# Patient Record
Sex: Female | Born: 1955 | Race: White | Hispanic: No | Marital: Married | Smoking: Former smoker
Health system: Southern US, Community
[De-identification: ages and names within clinical notes are randomized; demographics above are authoritative.]

## PROBLEM LIST (undated history)

## (undated) DIAGNOSIS — I341 Nonrheumatic mitral (valve) prolapse: Secondary | ICD-10-CM

## (undated) DIAGNOSIS — E785 Hyperlipidemia, unspecified: Secondary | ICD-10-CM

## (undated) DIAGNOSIS — R001 Bradycardia, unspecified: Secondary | ICD-10-CM

## (undated) HISTORY — PX: TONSILLECTOMY: SUR1361

## (undated) HISTORY — DX: Bradycardia, unspecified: R00.1

## (undated) HISTORY — PX: MOHS SURGERY: SUR867

## (undated) HISTORY — DX: Hyperlipidemia, unspecified: E78.5

## (undated) HISTORY — DX: Nonrheumatic mitral (valve) prolapse: I34.1

---

## 1997-08-05 ENCOUNTER — Other Ambulatory Visit: Admission: RE | Admit: 1997-08-05 | Discharge: 1997-08-05 | Payer: Self-pay | Admitting: Obstetrics & Gynecology

## 1999-01-03 ENCOUNTER — Other Ambulatory Visit: Admission: RE | Admit: 1999-01-03 | Discharge: 1999-01-03 | Payer: Self-pay | Admitting: Obstetrics & Gynecology

## 1999-11-17 ENCOUNTER — Other Ambulatory Visit: Admission: RE | Admit: 1999-11-17 | Discharge: 1999-11-17 | Payer: Self-pay | Admitting: Obstetrics & Gynecology

## 2001-01-20 ENCOUNTER — Other Ambulatory Visit: Admission: RE | Admit: 2001-01-20 | Discharge: 2001-01-20 | Payer: Self-pay | Admitting: Obstetrics & Gynecology

## 2002-03-02 ENCOUNTER — Other Ambulatory Visit: Admission: RE | Admit: 2002-03-02 | Discharge: 2002-03-02 | Payer: Self-pay | Admitting: Obstetrics & Gynecology

## 2003-05-17 ENCOUNTER — Other Ambulatory Visit: Admission: RE | Admit: 2003-05-17 | Discharge: 2003-05-17 | Payer: Self-pay | Admitting: Obstetrics & Gynecology

## 2004-03-03 ENCOUNTER — Ambulatory Visit (HOSPITAL_COMMUNITY): Admission: RE | Admit: 2004-03-03 | Discharge: 2004-03-03 | Payer: Self-pay | Admitting: Neurosurgery

## 2004-06-20 ENCOUNTER — Other Ambulatory Visit: Admission: RE | Admit: 2004-06-20 | Discharge: 2004-06-20 | Payer: Self-pay | Admitting: Obstetrics & Gynecology

## 2005-07-12 ENCOUNTER — Other Ambulatory Visit: Admission: RE | Admit: 2005-07-12 | Discharge: 2005-07-12 | Payer: Self-pay | Admitting: Obstetrics & Gynecology

## 2006-05-28 ENCOUNTER — Encounter: Admission: RE | Admit: 2006-05-28 | Discharge: 2006-05-28 | Payer: Self-pay | Admitting: Obstetrics & Gynecology

## 2008-04-21 ENCOUNTER — Encounter: Admission: RE | Admit: 2008-04-21 | Discharge: 2008-04-21 | Payer: Self-pay | Admitting: Internal Medicine

## 2010-01-01 ENCOUNTER — Emergency Department (HOSPITAL_COMMUNITY): Admission: EM | Admit: 2010-01-01 | Discharge: 2010-01-02 | Payer: Self-pay | Admitting: Emergency Medicine

## 2010-04-23 ENCOUNTER — Encounter: Payer: Self-pay | Admitting: Neurosurgery

## 2013-12-30 ENCOUNTER — Other Ambulatory Visit: Payer: Self-pay | Admitting: Internal Medicine

## 2013-12-30 DIAGNOSIS — Z801 Family history of malignant neoplasm of trachea, bronchus and lung: Secondary | ICD-10-CM

## 2014-01-14 ENCOUNTER — Ambulatory Visit
Admission: RE | Admit: 2014-01-14 | Discharge: 2014-01-14 | Disposition: A | Discharge status: Home / Self Care | Payer: No Typology Code available for payment source | Source: Ambulatory Visit | Attending: Internal Medicine | Admitting: Internal Medicine

## 2014-01-14 DIAGNOSIS — Z801 Family history of malignant neoplasm of trachea, bronchus and lung: Secondary | ICD-10-CM

## 2016-01-31 DIAGNOSIS — E559 Vitamin D deficiency, unspecified: Secondary | ICD-10-CM | POA: Diagnosis not present

## 2016-01-31 DIAGNOSIS — Z0000 Encounter for general adult medical examination without abnormal findings: Secondary | ICD-10-CM | POA: Diagnosis not present

## 2016-02-02 DIAGNOSIS — Z0000 Encounter for general adult medical examination without abnormal findings: Secondary | ICD-10-CM | POA: Diagnosis not present

## 2016-02-03 ENCOUNTER — Telehealth: Payer: Self-pay | Admitting: Cardiology

## 2016-02-03 NOTE — Telephone Encounter (Signed)
Received records from [facility] for appointment on 02/16/16 with Dr Jens Somrenshaw.  Records [facility] to [facility] (medical records) for Dr Ludwig Clarksrenshaw's schedule on 02/16/16. lp

## 2016-02-10 ENCOUNTER — Telehealth: Payer: Self-pay | Admitting: Cardiology

## 2016-02-10 NOTE — Telephone Encounter (Signed)
[facility] on 02/20/16 with Dr Jens Somrenshaw.  [facility] ([facility]) for Dr Ludwig Clarksrenshaw's schedule on 02/20/16. lp

## 2016-02-13 NOTE — Progress Notes (Signed)
     HPI: 60 year old female for evaluation of bradycardia. Echocardiogram 2010 showed normal LV systolic function and trace mitral regurgitation. Treadmill was negative. Seen recently and referred for bradycardia. In reviewing electrocardiograms she has had ectopic bradycardia dating back for some years. Patient denies significant dyspnea on exertion, orthopnea, PND, pedal edema, chest pain, palpitations or syncope.  No current outpatient prescriptions on file.   No current facility-administered medications for this visit.     Not on File   Past Medical History:  Diagnosis Date  . Bradycardia   . MVP (mitral valve prolapse)     Past Surgical History:  Procedure Laterality Date  . TONSILLECTOMY      Social History   Social History  . Marital status: Married    Spouse name: N/A  . Number of children: 1  . Years of education: N/A   Occupational History  . Not on file.   Social History Main Topics  . Smoking status: Former Games developermoker  . Smokeless tobacco: Never Used  . Alcohol use Yes     Comment: 2-3 glasses wine  . Drug use: Unknown  . Sexual activity: Not on file   Other Topics Concern  . Not on file   Social History Narrative  . No narrative on file    Family History  Problem Relation Age of Onset  . Colon cancer Brother     ROS: no fevers or chills, productive cough, hemoptysis, dysphasia, odynophagia, melena, hematochezia, dysuria, hematuria, rash, seizure activity, orthopnea, PND, pedal edema, claudication. Remaining systems are negative.  Physical Exam:   Blood pressure 132/71, pulse (!) 53, height 5\' 7"  (1.702 m), weight 148 lb (67.1 kg).  General:  Well developed/well nourished in NAD Skin warm/dry Patient not depressed No peripheral clubbing Back-normal HEENT-normal/normal eyelids Neck supple/normal carotid upstroke bilaterally; no bruits; no JVD; no thyromegaly chest - CTA/ normal expansion CV - RRR/normal S1 and S2; no murmurs, rubs or  gallops;  PMI nondisplaced Abdomen -NT/ND, no HSM, no mass, + bowel sounds, no bruit 2+ femoral pulses, no bruits Ext-no edema, chords, 2+ DP Neuro-grossly nonfocal  ECG -Sinus bradycardia at a rate of 53. RV conduction delay. No ST changes.  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 Apparently she had one with primary care with a heart rate of 42. We will obtain that ECG for review. However she has no symptoms including no significant dyspnea, chest pain or history of syncope. She does not require further cardiac evaluation. Check TSH.   2 History of mitral valve prolapse-not evident on most recent echocardiogram.   Olga MillersBrian Lakiesha Ralphs, MD

## 2016-02-16 ENCOUNTER — Ambulatory Visit: Payer: No Typology Code available for payment source | Admitting: Cardiology

## 2016-02-20 ENCOUNTER — Encounter: Payer: Self-pay | Admitting: Cardiology

## 2016-02-20 ENCOUNTER — Ambulatory Visit (INDEPENDENT_AMBULATORY_CARE_PROVIDER_SITE_OTHER): Payer: BLUE CROSS/BLUE SHIELD | Admitting: Cardiology

## 2016-02-20 VITALS — BP 132/71 | HR 53 | Ht 67.0 in | Wt 148.0 lb

## 2016-02-20 DIAGNOSIS — R001 Bradycardia, unspecified: Secondary | ICD-10-CM

## 2016-02-20 NOTE — Patient Instructions (Signed)
Your physician recommends that you schedule a follow-up appointment in: AS NEEDED  

## 2016-05-04 ENCOUNTER — Ambulatory Visit (INDEPENDENT_AMBULATORY_CARE_PROVIDER_SITE_OTHER): Payer: BLUE CROSS/BLUE SHIELD | Admitting: Osteopathic Medicine

## 2016-05-04 VITALS — BP 104/66 | HR 60 | Temp 98.6°F | Resp 18 | Ht 67.0 in | Wt 149.0 lb

## 2016-05-04 DIAGNOSIS — R0981 Nasal congestion: Secondary | ICD-10-CM | POA: Diagnosis not present

## 2016-05-04 DIAGNOSIS — J029 Acute pharyngitis, unspecified: Secondary | ICD-10-CM

## 2016-05-04 LAB — POCT RAPID STREP A (OFFICE): Rapid Strep A Screen: NEGATIVE

## 2016-05-04 MED ORDER — IPRATROPIUM BROMIDE 0.03 % NA SOLN: 2.0000 | Freq: Three times a day (TID) | NASAL | 0 refills | Status: DC | PRN

## 2016-05-04 MED ORDER — PENICILLIN V POTASSIUM 500 MG PO TABS: 500.0000 mg | ORAL_TABLET | Freq: Three times a day (TID) | ORAL | 0 refills | Status: DC

## 2016-05-04 MED ORDER — LIDOCAINE VISCOUS HCL 2 % MT SOLN: 10.0000 mL | OROMUCOSAL | 0 refills | Status: DC | PRN

## 2016-05-04 NOTE — Progress Notes (Signed)
HPI: Danielle Ferrell is a 61 y.o. female who presents to [facility] today, 05/04/16 for chief complaint of:  Chief Complaint  Patient presents with  . Sore Throat    Acute Illness: . Location: throat, some sinus congestion . Quality: started w/ sore throat and coughing . Assoc signs/symptoms: see ROS - fatigue, sleeping more, feels like teeth hurt a bit, eyes look small  . Duration: 4-5 days . Modifying factors: has tried the following OTC/Rx medications: Cold-Eeze at first, not getting any better, resting    Past medical, social and family history reviewed.   Immune compromising conditions or other risk factors: none  Current medications and allergies reviewed.     Review of Systems:  Constitutional: 100 degree temperature, up and down, no chills  HEENT: some headache, Yes  sore throat, No  swollen glands  Cardiovascular: No chest pain  Respiratory:No  cough, No  shortness of breath  Gastrointestinal: No  nausea, No  vomiting,  No  diarrhea  Musculoskeletal:   Yes  myalgia/arthralgia  Skin/Integument:  No  rash   Detailed Exam:  BP 104/66 (BP Location: Right Arm, Patient Position: Sitting, Cuff Size: Small)   Pulse 60   Temp 98.6 F (37 C) (Oral)   Resp 18   Ht 5\' 7"  (1.702 m)   Wt 149 lb (67.6 kg)   SpO2 99%   BMI 23.34 kg/m   Constitutional:   VSS, see above.   General Appearance: alert, well-developed, well-nourished, NAD  Eyes:   Normal lids and conjunctive, non-icteric sclera  Ears, Nose, Mouth, Throat:   Normal external inspection ears/nares  Normal mouth/lips/gums, MMM  normal TM  posterior pharynx with erythema, without exudate  nasal mucosa normal  Skin:  Normal inspection, no rash or concerning lesions noted on limited exam  Neck:   No masses, trachea midline. no elarged or tender lymph nodes  Respiratory:   Normal respiratory effort.   No  wheeze/rhonchi/rales  Cardiovascular:   S1/S2 normal, no  murmur/rub/gallop auscultated. RRR.   Results for orders placed or performed in visit on 05/04/16 (from the past 72 hour(s))  POCT rapid strep A     Status: None   Collection Time: 05/04/16  6:06 PM  Result Value Ref Range   Rapid Strep A Screen Negative Negative   No results found.  MODIFIED CENTOR CRITERIA (ponts if "yes"): Tonsillar exudate (1): no Tender Ant Cervical LN (1): mild tender but not enlarged Absence of cough (1): yes Fever (1): yes Age  17-14 (1): no 15-45 (0): no >/= 45 (-1): yes SCORE: 1-2    ASSESSMENT/PLAN:  Sore throat - Plan: POCT rapid strep A, Lidocaine HCl 2 % SOLN, Culture, Group A Strep, penicillin v potassium (VEETID) 500 MG tablet, ipratropium (ATROVENT) 0.03 % nasal spray  Nasal congestion - Plan: ipratropium (ATROVENT) 0.03 % nasal spray  OTC medications list printed - highlighted portion of sore throat treatment OTC to try  Abx to fill if fever, worsening, or if called about (+) strep culture. See printed Rx instructions.    Patient Instructions   Pharyngitis Pharyngitis is redness, pain, and swelling (inflammation) of your pharynx. What are the causes? Pharyngitis is usually caused by infection. Most of the time, these infections are from viruses (viral) and are part of a cold. However, sometimes pharyngitis is caused by bacteria (bacterial). Pharyngitis can also be caused by allergies. Viral pharyngitis may be spread from person to person by coughing, sneezing, and personal items  or utensils (cups, forks, spoons, toothbrushes). Bacterial pharyngitis may be spread from person to person by more intimate contact, such as kissing. What are the signs or symptoms? Symptoms of pharyngitis include:  Sore throat.  Tiredness (fatigue).  Low-grade fever.  Headache.  Joint pain and muscle aches.  Skin rashes.  Swollen lymph nodes.  Plaque-like film on throat or tonsils (often seen with bacterial pharyngitis). How is this diagnosed? Your  health care provider will ask you questions about your illness and your symptoms. Your medical history, along with a physical exam, is often all that is needed to diagnose pharyngitis. Sometimes, a rapid strep test is done. Other lab tests may also be done, depending on the suspected cause. How is this treated? Viral pharyngitis will usually get better in 3-4 days without the use of medicine. Bacterial pharyngitis is treated with medicines that kill germs (antibiotics). Follow these instructions at home:  Drink enough water and fluids to keep your urine clear or pale yellow.  Only take over-the-counter or prescription medicines as directed by your health care provider:  If you are prescribed antibiotics, make sure you finish them even if you start to feel better.  Do not take aspirin.  Get lots of rest.  Gargle with 8 oz of salt water ( tsp of salt per 1 qt of water) as often as every 1-2 hours to soothe your throat.  Throat lozenges (if you are not at risk for choking) or sprays may be used to soothe your throat. Contact a health care provider if:  You have large, tender lumps in your neck.  You have a rash.  You cough up green, yellow-brown, or bloody spit. Get help right away if:  Your neck becomes stiff.  You drool or are unable to swallow liquids.  You vomit or are unable to keep medicines or liquids down.  You have severe pain that does not go away with the use of recommended medicines.  You have trouble breathing (not caused by a stuffy nose). This information is not intended to replace advice given to you by your health care provider. Make sure you discuss any questions you have with your health care provider. Document Released: 03/19/2005 Document Revised: 08/25/2015 Document Reviewed: 11/24/2012 Elsevier Interactive Patient Education  2017 [employer].   OTC medications for viral illness Note: the following list assumes no pregnancy, normal liver & kidney  function and no other drug interactions. Dr. Lyn Hollingshead has highlighted medications which are safe for you to use, but these may not be appropriate for everyone. Always ask a pharmacist or qualified medical provider if there are any questions!    Aches/Pains, Fever Acetaminophen (Tylenol) 500 mg tablets - take max 2 tablets (1000 mg) every 6 hours (4 times per day)  Ibuprofen (Motrin) 200 mg tablets - take max 4 tablets (800 mg) every 6 hours  Sinus Congestion Prescription Atrovent Cromolyn Nasal Spray (NasalCrom) 1 spray each nostril 3-4 times per day, max 6 imes per day Nasal Saline if desired Oxymetolazone (Afrin, others) sparing use due to rebound congestion Phenylephrine (Sudafed) 10 mg tablets every 4 hours (or the 12-hour formulation) Diphenhydramine (Benadryl) 25 mg tablets - take max 2 tablets every 4 hours  Cough & Sore Throat Prescription cough pills or syrups Dextromethorphan (Robitussin, others) - cough suppressant Guaifenesin (Robitussin, Mucinex, others) - expectorant (helps cough up mucus) (Dextromethorphan and Guaifenesin also come in a combination tablet) Lozenges w/ Benzocaine + Menthol (Cepacol) Honey - as much as you want! Teas which "  coat the throat" - look for ingredients Elm Bark, Licorice Root, Marshmallow Root Humidifier by the bed  Other Zinc Lozenges within 24 hours of symptoms onset - mixed evidence this shortens the duration of the common cold Don't waste your money on Vitamin C or Echinacea       IF you received an x-ray today, you will receive an invoice from [facility]. Please contact [facility] at [phone] with questions or concerns regarding your invoice.   IF you received labwork today, you will receive an invoice from [facility]. Please contact [facility] at [phone] with questions or concerns regarding your invoice.   Our billing staff will not be able to assist you with questions regarding bills from these  companies.  You will be contacted with the lab results as soon as they are available. The fastest way to get your results is to activate your My Chart account. Instructions are located on the last page of this paperwork. If you have not heard from [place] regarding the results in 2 weeks, please contact this office.        Visit summary was printed for the patient with medications and pertinent instructions for patient to review. ER/RTC precautions reviewed. All questions answered. No Follow-up on file.

## 2016-05-04 NOTE — Patient Instructions (Addendum)
Pharyngitis Pharyngitis is redness, pain, and swelling (inflammation) of your pharynx. What are the causes? Pharyngitis is usually caused by infection. Most of the time, these infections are from viruses (viral) and are part of a cold. However, sometimes pharyngitis is caused by bacteria (bacterial). Pharyngitis can also be caused by allergies. Viral pharyngitis may be spread from person to person by coughing, sneezing, and personal items or utensils (cups, forks, spoons, toothbrushes). Bacterial pharyngitis may be spread from person to person by more intimate contact, such as kissing. What are the signs or symptoms? Symptoms of pharyngitis include:  Sore throat.  Tiredness (fatigue).  Low-grade fever.  Headache.  Joint pain and muscle aches.  Skin rashes.  Swollen lymph nodes.  Plaque-like film on throat or tonsils (often seen with bacterial pharyngitis). How is this diagnosed? Your health care provider will ask you questions about your illness and your symptoms. Your medical history, along with a physical exam, is often all that is needed to diagnose pharyngitis. Sometimes, a rapid strep test is done. Other lab tests may also be done, depending on the suspected cause. How is this treated? Viral pharyngitis will usually get better in 3-4 days without the use of medicine. Bacterial pharyngitis is treated with medicines that kill germs (antibiotics). Follow these instructions at home:  Drink enough water and fluids to keep your urine clear or pale yellow.  Only take over-the-counter or prescription medicines as directed by your health care provider:  If you are prescribed antibiotics, make sure you finish them even if you start to feel better.  Do not take aspirin.  Get lots of rest.  Gargle with 8 oz of salt water ( tsp of salt per 1 qt of water) as often as every 1-2 hours to soothe your throat.  Throat lozenges (if you are not at risk for choking) or sprays may be used to  soothe your throat. Contact a health care provider if:  You have large, tender lumps in your neck.  You have a rash.  You cough up green, yellow-brown, or bloody spit. Get help right away if:  Your neck becomes stiff.  You drool or are unable to swallow liquids.  You vomit or are unable to keep medicines or liquids down.  You have severe pain that does not go away with the use of recommended medicines.  You have trouble breathing (not caused by a stuffy nose). This information is not intended to replace advice given to you by your health care provider. Make sure you discuss any questions you have with your health care provider. Document Released: 03/19/2005 Document Revised: 08/25/2015 Document Reviewed: 11/24/2012 Elsevier Interactive Patient Education  2017 [employer].   OTC medications for viral illness Note: the following list assumes no pregnancy, normal liver & kidney function and no other drug interactions. Dr. Lyn Hollingshead has highlighted medications which are safe for you to use, but these may not be appropriate for everyone. Always ask a pharmacist or qualified medical provider if there are any questions!    Aches/Pains, Fever Acetaminophen (Tylenol) 500 mg tablets - take max 2 tablets (1000 mg) every 6 hours (4 times per day)  Ibuprofen (Motrin) 200 mg tablets - take max 4 tablets (800 mg) every 6 hours  Sinus Congestion Prescription Atrovent Cromolyn Nasal Spray (NasalCrom) 1 spray each nostril 3-4 times per day, max 6 imes per day Nasal Saline if desired Oxymetolazone (Afrin, others) sparing use due to rebound congestion Phenylephrine (Sudafed) 10 mg tablets every 4 hours (or  the 12-hour formulation) Diphenhydramine (Benadryl) 25 mg tablets - take max 2 tablets every 4 hours  Cough & Sore Throat Prescription cough pills or syrups Dextromethorphan (Robitussin, others) - cough suppressant Guaifenesin (Robitussin, Mucinex, others) - expectorant (helps cough up  mucus) (Dextromethorphan and Guaifenesin also come in a combination tablet) Lozenges w/ Benzocaine + Menthol (Cepacol) Honey - as much as you want! Teas which "coat the throat" - look for ingredients Elm Bark, Licorice Root, Marshmallow Root Humidifier by the bed  Other Zinc Lozenges within 24 hours of symptoms onset - mixed evidence this shortens the duration of the common cold Don't waste your money on Vitamin C or Echinacea       IF you received an x-ray today, you will receive an invoice from [facility]. Please contact [facility] at [phone] with questions or concerns regarding your invoice.   IF you received labwork today, you will receive an invoice from [facility]. Please contact [facility] at [phone] with questions or concerns regarding your invoice.   Our billing staff will not be able to assist you with questions regarding bills from these companies.  You will be contacted with the lab results as soon as they are available. The fastest way to get your results is to activate your My Chart account. Instructions are located on the last page of this paperwork. If you have not heard from us regarding the results in 2 weeks, please contact this office.

## 2016-05-07 LAB — CULTURE, GROUP A STREP: STREP A CULTURE: NEGATIVE

## 2016-05-15 ENCOUNTER — Encounter: Payer: Self-pay | Admitting: Obstetrics & Gynecology

## 2016-05-15 DIAGNOSIS — Z1151 Encounter for screening for human papillomavirus (HPV): Secondary | ICD-10-CM | POA: Diagnosis not present

## 2016-05-15 DIAGNOSIS — R875 Abnormal microbiological findings in specimens from female genital organs: Secondary | ICD-10-CM | POA: Diagnosis not present

## 2016-05-15 DIAGNOSIS — Z6822 Body mass index (BMI) 22.0-22.9, adult: Secondary | ICD-10-CM | POA: Diagnosis not present

## 2016-05-15 DIAGNOSIS — Z1231 Encounter for screening mammogram for malignant neoplasm of breast: Secondary | ICD-10-CM | POA: Diagnosis not present

## 2016-05-15 DIAGNOSIS — Z01419 Encounter for gynecological examination (general) (routine) without abnormal findings: Secondary | ICD-10-CM | POA: Diagnosis not present

## 2016-05-16 DIAGNOSIS — D225 Melanocytic nevi of trunk: Secondary | ICD-10-CM | POA: Diagnosis not present

## 2016-05-16 DIAGNOSIS — D2272 Melanocytic nevi of left lower limb, including hip: Secondary | ICD-10-CM | POA: Diagnosis not present

## 2016-05-16 DIAGNOSIS — L570 Actinic keratosis: Secondary | ICD-10-CM | POA: Diagnosis not present

## 2016-05-16 DIAGNOSIS — D2221 Melanocytic nevi of right ear and external auricular canal: Secondary | ICD-10-CM | POA: Diagnosis not present

## 2016-05-18 DIAGNOSIS — R87619 Unspecified abnormal cytological findings in specimens from cervix uteri: Secondary | ICD-10-CM | POA: Diagnosis not present

## 2016-07-31 DIAGNOSIS — M25562 Pain in left knee: Secondary | ICD-10-CM | POA: Diagnosis not present

## 2016-08-22 DIAGNOSIS — D485 Neoplasm of uncertain behavior of skin: Secondary | ICD-10-CM | POA: Diagnosis not present

## 2016-08-22 DIAGNOSIS — C44712 Basal cell carcinoma of skin of right lower limb, including hip: Secondary | ICD-10-CM | POA: Diagnosis not present

## 2016-11-06 DIAGNOSIS — C44712 Basal cell carcinoma of skin of right lower limb, including hip: Secondary | ICD-10-CM | POA: Diagnosis not present

## 2017-02-06 DIAGNOSIS — E559 Vitamin D deficiency, unspecified: Secondary | ICD-10-CM | POA: Diagnosis not present

## 2017-02-06 DIAGNOSIS — Z0000 Encounter for general adult medical examination without abnormal findings: Secondary | ICD-10-CM | POA: Diagnosis not present

## 2017-02-12 DIAGNOSIS — E875 Hyperkalemia: Secondary | ICD-10-CM | POA: Diagnosis not present

## 2017-03-21 DIAGNOSIS — M4722 Other spondylosis with radiculopathy, cervical region: Secondary | ICD-10-CM | POA: Diagnosis not present

## 2017-03-21 DIAGNOSIS — M542 Cervicalgia: Secondary | ICD-10-CM | POA: Diagnosis not present

## 2017-04-08 DIAGNOSIS — M7912 Myalgia of auxiliary muscles, head and neck: Secondary | ICD-10-CM | POA: Diagnosis not present

## 2017-04-08 DIAGNOSIS — M9901 Segmental and somatic dysfunction of cervical region: Secondary | ICD-10-CM | POA: Diagnosis not present

## 2017-04-08 DIAGNOSIS — M9902 Segmental and somatic dysfunction of thoracic region: Secondary | ICD-10-CM | POA: Diagnosis not present

## 2017-04-08 DIAGNOSIS — M5412 Radiculopathy, cervical region: Secondary | ICD-10-CM | POA: Diagnosis not present

## 2017-04-10 DIAGNOSIS — M9902 Segmental and somatic dysfunction of thoracic region: Secondary | ICD-10-CM | POA: Diagnosis not present

## 2017-04-10 DIAGNOSIS — M5412 Radiculopathy, cervical region: Secondary | ICD-10-CM | POA: Diagnosis not present

## 2017-04-10 DIAGNOSIS — M7912 Myalgia of auxiliary muscles, head and neck: Secondary | ICD-10-CM | POA: Diagnosis not present

## 2017-04-10 DIAGNOSIS — M9901 Segmental and somatic dysfunction of cervical region: Secondary | ICD-10-CM | POA: Diagnosis not present

## 2017-04-12 DIAGNOSIS — M5412 Radiculopathy, cervical region: Secondary | ICD-10-CM | POA: Diagnosis not present

## 2017-04-12 DIAGNOSIS — M7912 Myalgia of auxiliary muscles, head and neck: Secondary | ICD-10-CM | POA: Diagnosis not present

## 2017-04-12 DIAGNOSIS — M9902 Segmental and somatic dysfunction of thoracic region: Secondary | ICD-10-CM | POA: Diagnosis not present

## 2017-04-12 DIAGNOSIS — M9901 Segmental and somatic dysfunction of cervical region: Secondary | ICD-10-CM | POA: Diagnosis not present

## 2017-05-21 DIAGNOSIS — H52203 Unspecified astigmatism, bilateral: Secondary | ICD-10-CM | POA: Diagnosis not present

## 2017-05-21 DIAGNOSIS — Z9889 Other specified postprocedural states: Secondary | ICD-10-CM | POA: Diagnosis not present

## 2017-05-21 DIAGNOSIS — H524 Presbyopia: Secondary | ICD-10-CM | POA: Diagnosis not present

## 2017-05-21 DIAGNOSIS — H5203 Hypermetropia, bilateral: Secondary | ICD-10-CM | POA: Diagnosis not present

## 2018-01-17 ENCOUNTER — Ambulatory Visit (INDEPENDENT_AMBULATORY_CARE_PROVIDER_SITE_OTHER): Payer: BLUE CROSS/BLUE SHIELD | Admitting: Podiatry

## 2018-01-17 ENCOUNTER — Ambulatory Visit (INDEPENDENT_AMBULATORY_CARE_PROVIDER_SITE_OTHER): Payer: BLUE CROSS/BLUE SHIELD

## 2018-01-17 ENCOUNTER — Other Ambulatory Visit: Payer: Self-pay | Admitting: Podiatry

## 2018-01-17 DIAGNOSIS — S99922A Unspecified injury of left foot, initial encounter: Secondary | ICD-10-CM

## 2018-01-17 DIAGNOSIS — S92422A Displaced fracture of distal phalanx of left great toe, initial encounter for closed fracture: Secondary | ICD-10-CM | POA: Diagnosis not present

## 2018-01-17 DIAGNOSIS — M79675 Pain in left toe(s): Secondary | ICD-10-CM | POA: Diagnosis not present

## 2018-01-17 NOTE — Patient Instructions (Signed)

## 2018-01-24 ENCOUNTER — Telehealth: Payer: Self-pay | Admitting: Podiatry

## 2018-01-24 NOTE — Telephone Encounter (Signed)
I called pt, informed pt she needed to keep her activities to ADL in the stiff soled surgery shoe, for 4-6 weeks. Pt asked if she could exercise. I told pt she could do upper body, but nothing that pushed with the feet. Pt asked if she could do the bike and I told her yes, but with no tension. Pt states understanding.

## 2018-01-24 NOTE — Telephone Encounter (Signed)
I was seen a week ago for a fractured toe. I would like to know what I can and cannot do. I was given instructions for soaking. I look forward to your kind reply and my number is [phone].

## 2018-02-07 ENCOUNTER — Ambulatory Visit (INDEPENDENT_AMBULATORY_CARE_PROVIDER_SITE_OTHER): Payer: BLUE CROSS/BLUE SHIELD

## 2018-02-07 ENCOUNTER — Ambulatory Visit (INDEPENDENT_AMBULATORY_CARE_PROVIDER_SITE_OTHER): Payer: BLUE CROSS/BLUE SHIELD | Admitting: Podiatry

## 2018-02-07 DIAGNOSIS — S99922D Unspecified injury of left foot, subsequent encounter: Secondary | ICD-10-CM

## 2018-02-07 DIAGNOSIS — L608 Other nail disorders: Secondary | ICD-10-CM

## 2018-02-07 DIAGNOSIS — S92422D Displaced fracture of distal phalanx of left great toe, subsequent encounter for fracture with routine healing: Secondary | ICD-10-CM | POA: Diagnosis not present

## 2018-02-13 DIAGNOSIS — E875 Hyperkalemia: Secondary | ICD-10-CM | POA: Diagnosis not present

## 2018-02-13 DIAGNOSIS — Z0000 Encounter for general adult medical examination without abnormal findings: Secondary | ICD-10-CM | POA: Diagnosis not present

## 2018-02-13 DIAGNOSIS — E001 Congenital iodine-deficiency syndrome, myxedematous type: Secondary | ICD-10-CM | POA: Diagnosis not present

## 2018-02-17 DIAGNOSIS — Z0000 Encounter for general adult medical examination without abnormal findings: Secondary | ICD-10-CM | POA: Diagnosis not present

## 2018-02-17 DIAGNOSIS — R001 Bradycardia, unspecified: Secondary | ICD-10-CM | POA: Diagnosis not present

## 2018-02-20 ENCOUNTER — Ambulatory Visit (INDEPENDENT_AMBULATORY_CARE_PROVIDER_SITE_OTHER): Payer: BLUE CROSS/BLUE SHIELD

## 2018-02-20 ENCOUNTER — Ambulatory Visit (INDEPENDENT_AMBULATORY_CARE_PROVIDER_SITE_OTHER): Payer: BLUE CROSS/BLUE SHIELD | Admitting: Podiatry

## 2018-02-20 DIAGNOSIS — S92422D Displaced fracture of distal phalanx of left great toe, subsequent encounter for fracture with routine healing: Secondary | ICD-10-CM | POA: Diagnosis not present

## 2018-02-20 NOTE — Progress Notes (Signed)
  Subjective:  Patient ID: Danielle Ferrell, female    DOB: 1955-07-06,  MRN: [id number]  Chief Complaint  Patient presents with  . Wound Check    Left 1st toe wound check. Pt states no new complaints.   62 y.o. female presents with the above complaint. States the toenail is looking fine and not hurting and wants to go back to activity. Soaked as directed for a bit.  Review of Systems: Negative except as noted in the HPI. Denies N/V/F/Ch.  Past Medical History:  Diagnosis Date  . Bradycardia   . MVP (mitral valve prolapse)     Current Outpatient Medications:  .  ipratropium (ATROVENT) 0.03 % nasal spray, Place 2 sprays into both nostrils 3 (three) times daily as needed for rhinitis. (Patient not taking: Reported on 01/17/2018), Disp: 30 mL, Rfl: 0 .  Lidocaine HCl 2 % SOLN, Use as directed 10-15 mLs in the mouth or throat every 3 (three) hours as needed (mouth/throat pain). (Patient not taking: Reported on 01/17/2018), Disp: 100 mL, Rfl: 0 .  penicillin v potassium (VEETID) 500 MG tablet, Take 1 tablet (500 mg total) by mouth 3 (three) times daily. Fill Rx if worsening sore throat, fever >100.1, or if you receive a phone call that culture was positive for strep (Patient not taking: Reported on 01/17/2018), Disp: 30 tablet, Rfl: 0  Social History   Tobacco Use  Smoking Status Former Smoker  Smokeless Tobacco Never Used    No Known Allergies Objective:  There were no vitals filed for this visit. There is no height or weight on file to calculate BMI. Constitutional Well developed. Well nourished.  Vascular Dorsalis pedis pulses palpable bilaterally. Posterior tibial pulses palpable bilaterally. Capillary refill normal to all digits.  No cyanosis or clubbing noted. Pedal hair growth normal.  Neurologic Normal speech. Oriented to person, place, and time. Epicritic sensation to light touch grossly present bilaterally.  Dermatologic Nail avulsion site healing well with overlying  soft crust.  Orthopedic: Normal joint ROM without pain or crepitus bilaterally. No visible deformities. POP R distal phalanx hallux   Radiographs: Taken and reviewed slight interval healing noted. Assessment:   1. Closed displaced fracture of distal phalanx of left great toe with routine healing, subsequent encounter   2. Onychomadesis of toenail   3. Injury of toenail of left foot, subsequent encounter    Plan:  Patient was evaluated and treated and all questions answered.  L Distal Phalanx Fx -XR taken with slight interval healing noted.  S/p Nail Avulsion -Healing well  Return in about 2 weeks (around 02/21/2018) for Fracture f/u.

## 2018-02-23 NOTE — Progress Notes (Signed)
  Subjective:  Patient ID: Danielle SagoSuzanne B Carnero, female    DOB: 1955-12-28,  MRN: [id number]  Chief Complaint  Patient presents with  . Fracture    left great toe - 3 week follow up    62 y.o. female presents with the above complaint.  Not really having any pain.  Wishes to get out of her surgical shoe.    Review of Systems: Negative except as noted in the HPI. Denies N/V/F/Ch.  Past Medical History:  Diagnosis Date  . Bradycardia   . MVP (mitral valve prolapse)     Current Outpatient Medications:  .  ipratropium (ATROVENT) 0.03 % nasal spray, Place 2 sprays into both nostrils 3 (three) times daily as needed for rhinitis. (Patient not taking: Reported on 01/17/2018), Disp: 30 mL, Rfl: 0 .  Lidocaine HCl 2 % SOLN, Use as directed 10-15 mLs in the mouth or throat every 3 (three) hours as needed (mouth/throat pain). (Patient not taking: Reported on 01/17/2018), Disp: 100 mL, Rfl: 0 .  penicillin v potassium (VEETID) 500 MG tablet, Take 1 tablet (500 mg total) by mouth 3 (three) times daily. Fill Rx if worsening sore throat, fever >100.1, or if you receive a phone call that culture was positive for strep (Patient not taking: Reported on 01/17/2018), Disp: 30 tablet, Rfl: 0  Social History   Tobacco Use  Smoking Status Former Smoker  Smokeless Tobacco Never Used    No Known Allergies Objective:  There were no vitals filed for this visit. There is no height or weight on file to calculate BMI. Constitutional Well developed. Well nourished.  Vascular Dorsalis pedis pulses palpable bilaterally. Posterior tibial pulses palpable bilaterally. Capillary refill normal to all digits.  No cyanosis or clubbing noted. Pedal hair growth normal.  Neurologic Normal speech. Oriented to person, place, and time. Epicritic sensation to light touch grossly present bilaterally.  Dermatologic Nails well groomed and normal in appearance. No open wounds. No skin lesions.  Orthopedic: Normal joint ROM  without pain or crepitus bilaterally. No visible deformities. POP R distal phalanx hallux   Radiographs: Taken and reviewed slight interval healing noted. Assessment:   1. Closed displaced fracture of distal phalanx of left great toe with routine healing, subsequent encounter    Plan:  Patient was evaluated and treated and all questions answered.  L Distal Phalanx Fx -X-rays taken reviewed no significant healing noted.  Educated on activity reduction and continue taping.  Advised on transitioning out of her surgical shoe with comfort. Advised to avoid tennis for at least 2 weeks.  Return in about 6 weeks (around 04/03/2018) for toe fracture f/u.

## 2018-03-01 NOTE — Progress Notes (Signed)
  Subjective:  Patient ID: Danielle Ferrell, female    DOB: 1955-07-21,  MRN: [id number]  Chief Complaint  Patient presents with  . Toe Pain    great toe on left foot is painful/swollen/tender after door falling on it    62 y.o. female presents with the above complaint. States the injury occurred a week ago. Had a door fell on it. Toe only hurts from time to time.   Review of Systems: Negative except as noted in the HPI. Denies N/V/F/Ch.  Past Medical History:  Diagnosis Date  . Bradycardia   . MVP (mitral valve prolapse)     Current Outpatient Medications:  .  ipratropium (ATROVENT) 0.03 % nasal spray, Place 2 sprays into both nostrils 3 (three) times daily as needed for rhinitis. (Patient not taking: Reported on 01/17/2018), Disp: 30 mL, Rfl: 0 .  Lidocaine HCl 2 % SOLN, Use as directed 10-15 mLs in the mouth or throat every 3 (three) hours as needed (mouth/throat pain). (Patient not taking: Reported on 01/17/2018), Disp: 100 mL, Rfl: 0 .  penicillin v potassium (VEETID) 500 MG tablet, Take 1 tablet (500 mg total) by mouth 3 (three) times daily. Fill Rx if worsening sore throat, fever >100.1, or if you receive a phone call that culture was positive for strep (Patient not taking: Reported on 01/17/2018), Disp: 30 tablet, Rfl: 0  Social History   Tobacco Use  Smoking Status Former Smoker  Smokeless Tobacco Never Used    No Known Allergies Objective:  There were no vitals filed for this visit. There is no height or weight on file to calculate BMI. Constitutional Well developed. Well nourished.  Vascular Dorsalis pedis pulses palpable bilaterally. Posterior tibial pulses palpable bilaterally. Capillary refill normal to all digits.  No cyanosis or clubbing noted. Pedal hair growth normal.  Neurologic Normal speech. Oriented to person, place, and time. Epicritic sensation to light touch grossly present bilaterally.  Dermatologic Nails well groomed and normal in appearance. L  hallux nail with subungual hemorrhage. No open wounds. No skin lesions.  Orthopedic: Normal joint ROM without pain or crepitus bilaterally. No visible deformities. POP L great toe distal phalax.   Radiographs: Taken and reviewed distal phalanx fracture with displacement Assessment:   1. Closed displaced fracture of distal phalanx of left great toe, initial encounter   2. Injury of toenail of left foot, initial encounter   3. Great toe pain, left    Plan:  Patient was evaluated and treated and all questions answered.  L Great Toe Distal Phalanx Fracture -XR as above -Surgical shoe dispensed. Educated on use. -Educated on buddy taping of the great toe. -Nail avulsed as below. Nail bed inspected no injury.  Procedure: Avulsion of toenail Location: Left 1st toenail  Anesthesia: Lidocaine 1% plain; 1.5 mL and Marcaine 0.5% plain; 1.5 mL, digital block. Skin Prep: Betadine. Dressing: Silvadene; telfa; dry, sterile, compression dressing. Technique: Following skin prep, the toe was exsanguinated and a tourniquet was secured at the base of the toe. The nail was freed and avulsed with a hemostat. The area was cleansed. The tourniquet was then removed and sterile dressing applied. Disposition: Patient tolerated procedure well.   Return in about 2 weeks (around 01/31/2018) for nail check left.

## 2018-04-03 ENCOUNTER — Ambulatory Visit: Payer: BLUE CROSS/BLUE SHIELD | Admitting: Podiatry

## 2018-04-04 ENCOUNTER — Ambulatory Visit (INDEPENDENT_AMBULATORY_CARE_PROVIDER_SITE_OTHER): Payer: BLUE CROSS/BLUE SHIELD | Admitting: Obstetrics & Gynecology

## 2018-04-04 ENCOUNTER — Encounter: Payer: Self-pay | Admitting: Obstetrics & Gynecology

## 2018-04-04 VITALS — BP 110/70 | Ht 67.0 in | Wt 149.0 lb

## 2018-04-04 DIAGNOSIS — N950 Postmenopausal bleeding: Secondary | ICD-10-CM | POA: Diagnosis not present

## 2018-04-04 DIAGNOSIS — Z780 Asymptomatic menopausal state: Secondary | ICD-10-CM | POA: Diagnosis not present

## 2018-04-04 DIAGNOSIS — Z01419 Encounter for gynecological examination (general) (routine) without abnormal findings: Secondary | ICD-10-CM

## 2018-04-04 DIAGNOSIS — Z13820 Encounter for screening for osteoporosis: Secondary | ICD-10-CM

## 2018-04-04 NOTE — Progress Notes (Signed)
Danielle Ferrell 1955/06/22 [id number]   History:    63 y.o. G1P1L1 Married Danielle Ferrell).  Son Danielle Ferrell doing well.  RP:  Established patient presenting for annual gyn exam   HPI: Postmenopausal, well on no HRT.  Had a mild spotting vaginally last month.  No pelvic pain.  Abstinent.  Urine/BMs normal.  Breasts normal.  BMI 23.34.  Fit, walking and playing tennis.  Health labs with [facility] recently.  Past medical history,surgical history, family history and social history were all reviewed and documented in the EPIC chart.  Gynecologic History No LMP recorded. Patient is postmenopausal. Contraception: abstinence Last Pap: 3-4 yrs ago. Results were: normal Last mammogram: 3 yrs ago.  Results were: normal per patient.  [facility]. Bone Density: 3-5 yrs.  Will schedule here now Colonoscopy: Scheduling through [facility]  Obstetric History OB History  Gravida Para Term Preterm AB Living  1 1       1   SAB TAB Ectopic Multiple Live Births               # Outcome Date GA Lbr Len/2nd Weight Sex Delivery Anes PTL Lv  1 Para              ROS: A ROS was performed and pertinent positives and negatives are included in the history.  GENERAL: No fevers or chills. HEENT: No change in vision, no earache, sore throat or sinus congestion. NECK: No pain or stiffness. CARDIOVASCULAR: No chest pain or pressure. No palpitations. PULMONARY: No shortness of breath, cough or wheeze. GASTROINTESTINAL: No abdominal pain, nausea, vomiting or diarrhea, melena or bright red blood per rectum. GENITOURINARY: No urinary frequency, urgency, hesitancy or dysuria. MUSCULOSKELETAL: No joint or muscle pain, no back pain, no recent trauma. DERMATOLOGIC: No rash, no itching, no lesions. ENDOCRINE: No polyuria, polydipsia, no heat or cold intolerance. No recent change in weight. HEMATOLOGICAL: No anemia or easy bruising or bleeding. NEUROLOGIC: No headache, seizures, numbness, tingling or weakness.  PSYCHIATRIC: No depression, no loss of interest in normal activity or change in sleep pattern.     Exam:   BP 110/70   Ht 5\' 7"  (1.702 m)   Wt 149 lb (67.6 kg)   BMI 23.34 kg/m   Body mass index is 23.34 kg/m.  General appearance : Well developed well nourished female. No acute distress HEENT: Eyes: no retinal hemorrhage or exudates,  Neck supple, trachea midline, no carotid bruits, no thyroidmegaly Lungs: Clear to auscultation, no rhonchi or wheezes, or rib retractions  Heart: Regular rate and rhythm, no murmurs or gallops Breast:Examined in sitting and supine position were symmetrical in appearance, no palpable masses or tenderness,  no skin retraction, no nipple inversion, no nipple discharge, no skin discoloration, no axillary or supraclavicular lymphadenopathy Abdomen: no palpable masses or tenderness, no rebound or guarding Extremities: no edema or skin discoloration or tenderness  Pelvic: Vulva: Normal             Vagina: No gross lesions or discharge  Cervix: No gross lesions or discharge.  Pap reflex done  Uterus  AV, normal size, shape and consistency, non-tender and mobile  Adnexa  Without masses or tenderness  Anus: Normal   Assessment/Plan:  63 y.o. female for annual exam   1. Encounter for routine gynecological examination with Papanicolaou smear of cervix Normal gynecologic exam in menopause.  Pap reflex done.  Breast exam normal.  Will schedule screening mammogram at the breast center now.  Health labs  with internist.  We will also schedule screening colonoscopy through her internist when due.  2. Postmenopausal Well on no hormone replacement therapy.  Small amount of postmenopausal bleeding x1.  Will investigate with a pelvic ultrasound.  3. Postmenopausal bleeding Very mild amount of vaginal spotting x1.  We will investigate with a pelvic ultrasound to verify the endometrial line, endometrial biopsy as needed. - US Transvaginal Non-OB; Future  4. Screening  for osteoporosis Schedule bone density here now.  Vitamin D supplements, calcium intake of 1500 mg/day including nutritional and supplemental, regular weightbearing physical activities to continue. - DG Bone Density; Future  Danielle Del MD, 10:04 AM 04/04/2018

## 2018-04-04 NOTE — Patient Instructions (Signed)
1. Encounter for routine gynecological examination with Papanicolaou smear of cervix Normal gynecologic exam in menopause.  Pap reflex done.  Breast exam normal.  Will schedule screening mammogram at the breast center now.  Health labs with internist.  We will also schedule screening colonoscopy through her internist when due.  2. Postmenopausal Well on no hormone replacement therapy.  Small amount of postmenopausal bleeding x1.  Will investigate with a pelvic ultrasound.  3. Postmenopausal bleeding Very mild amount of vaginal spotting x1.  We will investigate with a pelvic ultrasound to verify the endometrial line, endometrial biopsy as needed. - US Transvaginal Non-OB; Future  4. Screening for osteoporosis Schedule bone density here now.  Vitamin D supplements, calcium intake of 1500 mg/day including nutritional and supplemental, regular weightbearing physical activities to continue. - DG Bone Density; Future  Danielle Ferrell, always good seeing you!  I will inform you of your results as soon as they are available.

## 2018-04-07 LAB — PAP IG W/ RFLX HPV ASCU

## 2018-09-04 ENCOUNTER — Other Ambulatory Visit: Payer: Self-pay

## 2018-09-04 ENCOUNTER — Other Ambulatory Visit: Payer: Self-pay | Admitting: Obstetrics & Gynecology

## 2018-09-04 ENCOUNTER — Other Ambulatory Visit: Payer: Medicare Other

## 2018-09-04 DIAGNOSIS — Z8619 Personal history of other infectious and parasitic diseases: Secondary | ICD-10-CM

## 2018-09-04 LAB — SAR COV2 SEROLOGY (COVID19)AB(IGG),IA: SARS CoV2 AB IGG: NEGATIVE

## 2019-04-09 ENCOUNTER — Other Ambulatory Visit: Payer: Self-pay

## 2019-04-09 ENCOUNTER — Encounter: Payer: Self-pay | Admitting: Obstetrics & Gynecology

## 2019-04-09 ENCOUNTER — Ambulatory Visit (INDEPENDENT_AMBULATORY_CARE_PROVIDER_SITE_OTHER): Payer: Medicare Other | Admitting: Obstetrics & Gynecology

## 2019-04-09 VITALS — BP 120/76 | Ht 67.0 in | Wt 148.0 lb

## 2019-04-09 DIAGNOSIS — Z01419 Encounter for gynecological examination (general) (routine) without abnormal findings: Secondary | ICD-10-CM

## 2019-04-09 DIAGNOSIS — Z780 Asymptomatic menopausal state: Secondary | ICD-10-CM

## 2019-04-09 DIAGNOSIS — Z13820 Encounter for screening for osteoporosis: Secondary | ICD-10-CM

## 2019-04-09 NOTE — Patient Instructions (Signed)
1. Well female exam with routine gynecological exam Normal gynecologic exam in menopause.  Pap test negative in January 2020, no indication to repeat this year.  Breast exam normal.  Will schedule a screening mammogram at the breast center now.  Colonoscopy in 2015, brother with colon cancer, will schedule a repeat screening colonoscopy this year.  Rectal exam negative today.  Good body mass index at 23.18.  Continue with fitness and healthy nutrition.  Health labs with Dr. Renne Crigler.  2. Postmenopausal Well on no hormone replacement therapy.  No postmenopausal bleeding.  3. Screening for osteoporosis Bone Density 2018.  Will schedule a BD here now.  Calcium intake of 1200 mg daily and vitamin D supplements.  Continue with regular weightbearing physical activities. - DG Bone Density; Future  Jun, it was a pleasure seeing you today!

## 2019-04-09 NOTE — Progress Notes (Signed)
Danielle Ferrell [birth year] [id number]   History:    64 y.o. G1P1L1 Married Danielle Ferrell).  Son Danielle Ferrell doing well.  RP:  Established patient presenting for annual gyn exam   HPI: Postmenopausal, well on no HRT.  No PMB.  No pelvic pain.  Abstinent.  Pap negative 04/2018. Urine/BMs normal.  Breasts normal.  Mammo to schedule.  BMI 23.18.  Fit, walking and playing tennis.  Health labs with Dr Renne Crigler.  Colonoscopy 2015.  Brother with Colon Cancer.     Past medical history,surgical history, family history and social history were all reviewed and documented in the EPIC chart.  Gynecologic History No LMP recorded. Patient is postmenopausal.  Obstetric History OB History  Gravida Para Term Preterm AB Living  1 1       1   SAB TAB Ectopic Multiple Live Births               # Outcome Date GA Lbr Len/2nd Weight Sex Delivery Anes PTL Lv  1 Para              ROS: A ROS was performed and pertinent positives and negatives are included in the history.  GENERAL: No fevers or chills. HEENT: No change in vision, no earache, sore throat or sinus congestion. NECK: No pain or stiffness. CARDIOVASCULAR: No chest pain or pressure. No palpitations. PULMONARY: No shortness of breath, cough or wheeze. GASTROINTESTINAL: No abdominal pain, nausea, vomiting or diarrhea, melena or bright red blood per rectum. GENITOURINARY: No urinary frequency, urgency, hesitancy or dysuria. MUSCULOSKELETAL: No joint or muscle pain, no back pain, no recent trauma. DERMATOLOGIC: No rash, no itching, no lesions. ENDOCRINE: No polyuria, polydipsia, no heat or cold intolerance. No recent change in weight. HEMATOLOGICAL: No anemia or easy bruising or bleeding. NEUROLOGIC: No headache, seizures, numbness, tingling or weakness. PSYCHIATRIC: No depression, no loss of interest in normal activity or change in sleep pattern.     Exam:   BP 120/76   Ht 5\' 7"  (1.702 m)   Wt 148 lb (67.1 kg)   BMI 23.18 kg/m   Body mass index is 23.18  kg/m.  General appearance : Well developed well nourished female. No acute distress HEENT: Eyes: no retinal hemorrhage or exudates,  Neck supple, trachea midline, no carotid bruits, no thyroidmegaly Lungs: Clear to auscultation, no rhonchi or wheezes, or rib retractions  Heart: Regular rate and rhythm, no murmurs or gallops Breast:Examined in sitting and supine position were symmetrical in appearance, no palpable masses or tenderness,  no skin retraction, no nipple inversion, no nipple discharge, no skin discoloration, no axillary or supraclavicular lymphadenopathy Abdomen: no palpable masses or tenderness, no rebound or guarding Extremities: no edema or skin discoloration or tenderness  Pelvic: Vulva: Normal             Vagina: No gross lesions or discharge  Cervix: No gross lesions or discharge  Uterus  AV, normal size, shape and consistency, non-tender and mobile  Adnexa  Without masses or tenderness  Anus: Normal.  Rectal exam Normal   Assessment/Plan:  64 y.o. female for annual exam   1. Well female exam with routine gynecological exam Normal gynecologic exam in menopause.  Pap test negative in January 2020, no indication to repeat this year.  Breast exam normal.  Will schedule a screening mammogram at the breast center now.  Colonoscopy in 2015, brother with colon cancer, will schedule a repeat screening colonoscopy this year.  Rectal exam negative today.  Good body mass  index at 23.18.  Continue with fitness and healthy nutrition.  Health labs with Dr. Shelia Media.  2. Postmenopausal Well on no hormone replacement therapy.  No postmenopausal bleeding.  3. Screening for osteoporosis Bone Density 2018.  Will schedule a BD here now.  Calcium intake of 1200 mg daily and vitamin D supplements.  Continue with regular weightbearing physical activities. - DG Bone Density; Future  Princess Bruins MD, 9:22 AM 04/09/2019

## 2019-04-21 ENCOUNTER — Ambulatory Visit: Payer: Medicare Other | Attending: Internal Medicine

## 2019-04-21 DIAGNOSIS — Z23 Encounter for immunization: Secondary | ICD-10-CM

## 2019-04-21 NOTE — Progress Notes (Signed)
   Covid-19 Vaccination Clinic  Name:  Danielle Ferrell    MRN: [id number] DOB: [birth year]  04/21/2019  [name] was observed post Covid-19 immunization for 15 minutes without incidence. She was provided with Vaccine Information Sheet and instruction to access the V-Safe system.   [name] was instructed to call 911 with any severe reactions post vaccine: Marland Kitchen Difficulty breathing  . Swelling of your face and throat  . A fast heartbeat  . A bad rash all over your body  . Dizziness and weakness    Immunizations Administered    Name Date Dose VIS Date Route   Pfizer COVID-19 Vaccine 04/21/2019  6:07 PM 0.3 mL 03/13/2019 Intramuscular   Manufacturer: [employer], [employer]   Lot: [id number]   NDC: [address]-1

## 2019-05-11 ENCOUNTER — Ambulatory Visit: Payer: Medicare Other | Attending: Internal Medicine

## 2019-05-11 DIAGNOSIS — Z23 Encounter for immunization: Secondary | ICD-10-CM

## 2019-05-11 NOTE — Progress Notes (Signed)
   Covid-19 Vaccination Clinic  Name:  Danielle Ferrell    MRN: [id number] DOB: 25-Apr-1955  05/11/2019  Danielle Ferrell was observed post Covid-19 immunization for 15 minutes without incidence. She was provided with Vaccine Information Sheet and instruction to access the V-Safe system.   Danielle Ferrell was instructed to call 911 with any severe reactions post vaccine: Marland Kitchen Difficulty breathing  . Swelling of your face and throat  . A fast heartbeat  . A bad rash all over your body  . Dizziness and weakness    Immunizations Administered    Name Date Dose VIS Date Route   Pfizer COVID-19 Vaccine 05/11/2019  9:50 AM 0.3 mL 03/13/2019 Intramuscular   Manufacturer: [employer], [employer]   Lot: [id number]   NDC: [address]-9

## 2019-05-28 ENCOUNTER — Ambulatory Visit: Payer: No Typology Code available for payment source

## 2020-04-11 DIAGNOSIS — D0359 Melanoma in situ of other part of trunk: Secondary | ICD-10-CM | POA: Diagnosis not present

## 2020-04-11 DIAGNOSIS — Z85828 Personal history of other malignant neoplasm of skin: Secondary | ICD-10-CM | POA: Diagnosis not present

## 2020-04-11 DIAGNOSIS — C44519 Basal cell carcinoma of skin of other part of trunk: Secondary | ICD-10-CM | POA: Diagnosis not present

## 2020-04-11 DIAGNOSIS — L570 Actinic keratosis: Secondary | ICD-10-CM | POA: Diagnosis not present

## 2020-04-11 DIAGNOSIS — C44712 Basal cell carcinoma of skin of right lower limb, including hip: Secondary | ICD-10-CM | POA: Diagnosis not present

## 2020-05-17 ENCOUNTER — Other Ambulatory Visit: Payer: Self-pay | Admitting: Obstetrics & Gynecology

## 2020-05-17 DIAGNOSIS — Z1231 Encounter for screening mammogram for malignant neoplasm of breast: Secondary | ICD-10-CM

## 2020-05-24 DIAGNOSIS — D0359 Melanoma in situ of other part of trunk: Secondary | ICD-10-CM | POA: Diagnosis not present

## 2020-07-06 ENCOUNTER — Ambulatory Visit
Admission: RE | Admit: 2020-07-06 | Discharge: 2020-07-06 | Disposition: A | Discharge status: Home / Self Care | Payer: Medicare Other | Source: Ambulatory Visit | Attending: Obstetrics & Gynecology | Admitting: Obstetrics & Gynecology

## 2020-07-06 ENCOUNTER — Other Ambulatory Visit: Payer: Self-pay

## 2020-07-06 DIAGNOSIS — Z1231 Encounter for screening mammogram for malignant neoplasm of breast: Secondary | ICD-10-CM | POA: Diagnosis not present

## 2020-08-05 DIAGNOSIS — L570 Actinic keratosis: Secondary | ICD-10-CM | POA: Diagnosis not present

## 2020-08-05 DIAGNOSIS — Z85828 Personal history of other malignant neoplasm of skin: Secondary | ICD-10-CM | POA: Diagnosis not present

## 2020-08-05 DIAGNOSIS — C44712 Basal cell carcinoma of skin of right lower limb, including hip: Secondary | ICD-10-CM | POA: Diagnosis not present

## 2020-08-05 DIAGNOSIS — I788 Other diseases of capillaries: Secondary | ICD-10-CM | POA: Diagnosis not present

## 2020-08-10 DIAGNOSIS — Z20822 Contact with and (suspected) exposure to covid-19: Secondary | ICD-10-CM | POA: Diagnosis not present

## 2020-08-10 DIAGNOSIS — U071 COVID-19: Secondary | ICD-10-CM | POA: Diagnosis not present

## 2020-08-10 DIAGNOSIS — R059 Cough, unspecified: Secondary | ICD-10-CM | POA: Diagnosis not present

## 2020-09-27 DIAGNOSIS — H5203 Hypermetropia, bilateral: Secondary | ICD-10-CM | POA: Diagnosis not present

## 2020-10-31 DIAGNOSIS — Z85828 Personal history of other malignant neoplasm of skin: Secondary | ICD-10-CM | POA: Diagnosis not present

## 2020-10-31 DIAGNOSIS — C44622 Squamous cell carcinoma of skin of right upper limb, including shoulder: Secondary | ICD-10-CM | POA: Diagnosis not present

## 2020-10-31 DIAGNOSIS — D485 Neoplasm of uncertain behavior of skin: Secondary | ICD-10-CM | POA: Diagnosis not present

## 2020-10-31 DIAGNOSIS — L988 Other specified disorders of the skin and subcutaneous tissue: Secondary | ICD-10-CM | POA: Diagnosis not present

## 2020-11-01 DIAGNOSIS — D0359 Melanoma in situ of other part of trunk: Secondary | ICD-10-CM | POA: Diagnosis not present

## 2020-11-07 DIAGNOSIS — I788 Other diseases of capillaries: Secondary | ICD-10-CM | POA: Diagnosis not present

## 2020-11-07 DIAGNOSIS — Z85820 Personal history of malignant melanoma of skin: Secondary | ICD-10-CM | POA: Diagnosis not present

## 2020-11-07 DIAGNOSIS — Z85828 Personal history of other malignant neoplasm of skin: Secondary | ICD-10-CM | POA: Diagnosis not present

## 2020-11-07 DIAGNOSIS — L570 Actinic keratosis: Secondary | ICD-10-CM | POA: Diagnosis not present

## 2020-11-09 DIAGNOSIS — Z4802 Encounter for removal of sutures: Secondary | ICD-10-CM | POA: Diagnosis not present

## 2020-11-09 DIAGNOSIS — C44519 Basal cell carcinoma of skin of other part of trunk: Secondary | ICD-10-CM | POA: Diagnosis not present

## 2021-02-07 DIAGNOSIS — L570 Actinic keratosis: Secondary | ICD-10-CM | POA: Diagnosis not present

## 2021-02-07 DIAGNOSIS — I788 Other diseases of capillaries: Secondary | ICD-10-CM | POA: Diagnosis not present

## 2021-02-07 DIAGNOSIS — D485 Neoplasm of uncertain behavior of skin: Secondary | ICD-10-CM | POA: Diagnosis not present

## 2021-02-07 DIAGNOSIS — Z85828 Personal history of other malignant neoplasm of skin: Secondary | ICD-10-CM | POA: Diagnosis not present

## 2021-03-07 ENCOUNTER — Other Ambulatory Visit: Payer: Self-pay | Admitting: Internal Medicine

## 2021-03-07 DIAGNOSIS — R001 Bradycardia, unspecified: Secondary | ICD-10-CM | POA: Diagnosis not present

## 2021-03-07 DIAGNOSIS — Z0000 Encounter for general adult medical examination without abnormal findings: Secondary | ICD-10-CM | POA: Diagnosis not present

## 2021-03-07 DIAGNOSIS — H8110 Benign paroxysmal vertigo, unspecified ear: Secondary | ICD-10-CM | POA: Diagnosis not present

## 2021-03-07 DIAGNOSIS — J309 Allergic rhinitis, unspecified: Secondary | ICD-10-CM | POA: Diagnosis not present

## 2021-03-14 DIAGNOSIS — H8113 Benign paroxysmal vertigo, bilateral: Secondary | ICD-10-CM | POA: Diagnosis not present

## 2021-04-13 ENCOUNTER — Ambulatory Visit
Admission: RE | Admit: 2021-04-13 | Discharge: 2021-04-13 | Disposition: A | Discharge status: Home / Self Care | Payer: No Typology Code available for payment source | Source: Ambulatory Visit | Attending: Internal Medicine | Admitting: Internal Medicine

## 2021-04-13 DIAGNOSIS — R001 Bradycardia, unspecified: Secondary | ICD-10-CM

## 2021-04-13 DIAGNOSIS — I700 Atherosclerosis of aorta: Secondary | ICD-10-CM | POA: Diagnosis not present

## 2021-04-13 DIAGNOSIS — Z8679 Personal history of other diseases of the circulatory system: Secondary | ICD-10-CM | POA: Diagnosis not present

## 2021-04-20 DIAGNOSIS — I2510 Atherosclerotic heart disease of native coronary artery without angina pectoris: Secondary | ICD-10-CM | POA: Diagnosis not present

## 2021-05-17 DIAGNOSIS — I2510 Atherosclerotic heart disease of native coronary artery without angina pectoris: Secondary | ICD-10-CM | POA: Diagnosis not present

## 2021-06-02 ENCOUNTER — Ambulatory Visit: Payer: Medicare Other | Admitting: Obstetrics & Gynecology

## 2021-06-14 ENCOUNTER — Other Ambulatory Visit (HOSPITAL_COMMUNITY)
Admission: RE | Admit: 2021-06-14 | Discharge: 2021-06-14 | Disposition: A | Discharge status: Home / Self Care | Payer: Medicare Other | Source: Ambulatory Visit | Attending: Obstetrics & Gynecology | Admitting: Obstetrics & Gynecology

## 2021-06-14 ENCOUNTER — Other Ambulatory Visit: Payer: Self-pay

## 2021-06-14 ENCOUNTER — Encounter: Payer: Self-pay | Admitting: Obstetrics & Gynecology

## 2021-06-14 ENCOUNTER — Ambulatory Visit (INDEPENDENT_AMBULATORY_CARE_PROVIDER_SITE_OTHER): Payer: Medicare Other | Admitting: Obstetrics & Gynecology

## 2021-06-14 VITALS — BP 110/72 | HR 80 | Resp 16 | Ht 66.25 in | Wt 136.0 lb

## 2021-06-14 DIAGNOSIS — Z13820 Encounter for screening for osteoporosis: Secondary | ICD-10-CM

## 2021-06-14 DIAGNOSIS — Z01419 Encounter for gynecological examination (general) (routine) without abnormal findings: Secondary | ICD-10-CM

## 2021-06-14 DIAGNOSIS — Z9189 Other specified personal risk factors, not elsewhere classified: Secondary | ICD-10-CM | POA: Diagnosis not present

## 2021-06-14 DIAGNOSIS — Z780 Asymptomatic menopausal state: Secondary | ICD-10-CM

## 2021-06-14 DIAGNOSIS — Z124 Encounter for screening for malignant neoplasm of cervix: Secondary | ICD-10-CM | POA: Diagnosis not present

## 2021-06-14 NOTE — Progress Notes (Signed)
? ? ?Danielle Ferrell 1955-09-09 [id number] ? ? ?History:    66 y.o. G1P1L1 Married Visual merchandiser).  Son [name] doing well. ?  ?RP:  Established patient presenting for annual gyn exam  ?  ?HPI: Postmenopausal, well on no HRT.  No PMB.  No pelvic pain.  Abstinent.  Pap negative 04/2018. Urine/BMs normal.  Breasts normal.  Mammo 07/2020 Neg.  BMI 21.79.  Fit, walking and playing tennis.  BD normal per patient in 2018.  Will schedule a BD here now. Health labs with Dr Renne Crigler.  Colonoscopy 2015, will schedule this year.  Brother with Colon Cancer.   ? ?Past medical history,surgical history, family history and social history were all reviewed and documented in the EPIC chart. ? ?Gynecologic History ?No LMP recorded. Patient is postmenopausal. ? ?Obstetric History ?OB History  ?Gravida Para Term Preterm AB Living  ?1 1       1   ?SAB IAB Ectopic Multiple Live Births  ?           ?  ?# Outcome Date GA Lbr Len/2nd Weight Sex Delivery Anes PTL Lv  ?1 Para           ? ? ? ?ROS: A ROS was performed and pertinent positives and negatives are included in the history. ?GENERAL: No fevers or chills. HEENT: No change in vision, no earache, sore throat or sinus congestion. NECK: No pain or stiffness. CARDIOVASCULAR: No chest pain or pressure. No palpitations. PULMONARY: No shortness of breath, cough or wheeze. GASTROINTESTINAL: No abdominal pain, nausea, vomiting or diarrhea, melena or bright red blood per rectum. GENITOURINARY: No urinary frequency, urgency, hesitancy or dysuria. MUSCULOSKELETAL: No joint or muscle pain, no back pain, no recent trauma. DERMATOLOGIC: No rash, no itching, no lesions. ENDOCRINE: No polyuria, polydipsia, no heat or cold intolerance. No recent change in weight. HEMATOLOGICAL: No anemia or easy bruising or bleeding. NEUROLOGIC: No headache, seizures, numbness, tingling or weakness. PSYCHIATRIC: No depression, no loss of interest in normal activity or change in sleep pattern.  ?  ? ?Exam: ? ? ?BP 110/72   Pulse 80    Resp 16   Ht 5' 6.25" (1.683 m)   Wt 136 lb (61.7 kg)   BMI 21.79 kg/m?  ? ?Body mass index is 21.79 kg/m?. ? ?General appearance : Well developed well nourished female. No acute distress ?HEENT: Eyes: no retinal hemorrhage or exudates,  Neck supple, trachea midline, no carotid bruits, no thyroidmegaly ?Lungs: Clear to auscultation, no rhonchi or wheezes, or rib retractions  ?Heart: Regular rate and rhythm, no murmurs or gallops ?Breast:Examined in sitting and supine position were symmetrical in appearance, no palpable masses or tenderness,  no skin retraction, no nipple inversion, no nipple discharge, no skin discoloration, no axillary or supraclavicular lymphadenopathy ?Abdomen: no palpable masses or tenderness, no rebound or guarding ?Extremities: no edema or skin discoloration or tenderness ? ?Pelvic: Vulva: Normal ?            Vagina: No gross lesions or discharge ? Cervix: No gross lesions or discharge.  Pap reflex done. ? Uterus  AV, normal size, shape and consistency, non-tender and mobile ? Adnexa  Without masses or tenderness ? Anus: Normal ? ? ?Assessment/Plan:  66 y.o. female for annual exam  ? ?1. Encounter for routine gynecological examination with Papanicolaou smear of cervix ?Postmenopausal, well on no HRT.  No PMB.  No pelvic pain.  Abstinent.  Pap negative 04/2018. Urine/BMs normal.  Breasts normal.  Mammo 07/2020 Neg.  BMI 21.79.  Fit, walking and playing tennis.  BD normal per patient in 2018.  Will schedule a BD here now. Health labs with Dr Renne Crigler.  Colonoscopy 2015, will schedule this year.  Brother with Colon Cancer.   ?- Cytology - PAP( [place]) ? ?2. Postmenopause ?Postmenopausal, well on no HRT.  No PMB.  No pelvic pain.  Abstinent.  ? ?3. Screening for osteoporosis ?Last BD 2018.  Will repeat BD here now.  Ca++ 1.5 g/d total, Vit D supplement, continue with regular weight bearing physical activities. ?- DG Bone Density; Future ? ?4. Other specified personal risk factors, not elsewhere  classified ? ?Other orders ?- rosuvastatin (CRESTOR) 5 MG tablet; Take 5 mg by mouth daily. ?- UNABLE TO FIND; Fluorouracil 5% + calcipotriene 0.005% (Patient not taking: Reported on 06/14/2021)  ? ?Genia Del MD, 10:06 AM 06/14/2021 ? ?  ?

## 2021-06-15 LAB — CYTOLOGY - PAP: Diagnosis: NEGATIVE

## 2021-08-02 ENCOUNTER — Other Ambulatory Visit: Payer: Self-pay | Admitting: Obstetrics & Gynecology

## 2021-08-02 ENCOUNTER — Ambulatory Visit (INDEPENDENT_AMBULATORY_CARE_PROVIDER_SITE_OTHER): Payer: Medicare Other

## 2021-08-02 DIAGNOSIS — Z13820 Encounter for screening for osteoporosis: Secondary | ICD-10-CM | POA: Diagnosis not present

## 2021-08-02 DIAGNOSIS — M8589 Other specified disorders of bone density and structure, multiple sites: Secondary | ICD-10-CM

## 2021-08-02 DIAGNOSIS — Z780 Asymptomatic menopausal state: Secondary | ICD-10-CM | POA: Diagnosis not present

## 2021-08-07 ENCOUNTER — Other Ambulatory Visit: Payer: Self-pay | Admitting: Obstetrics & Gynecology

## 2021-08-07 DIAGNOSIS — Z1231 Encounter for screening mammogram for malignant neoplasm of breast: Secondary | ICD-10-CM

## 2021-08-22 ENCOUNTER — Ambulatory Visit: Payer: Medicare Other

## 2021-08-22 ENCOUNTER — Ambulatory Visit
Admission: RE | Admit: 2021-08-22 | Discharge: 2021-08-22 | Disposition: A | Discharge status: Home / Self Care | Payer: Medicare Other | Source: Ambulatory Visit | Attending: Obstetrics & Gynecology | Admitting: Obstetrics & Gynecology

## 2021-08-22 DIAGNOSIS — Z1231 Encounter for screening mammogram for malignant neoplasm of breast: Secondary | ICD-10-CM | POA: Diagnosis not present

## 2021-10-29 DIAGNOSIS — Y999 Unspecified external cause status: Secondary | ICD-10-CM | POA: Diagnosis not present

## 2021-10-29 DIAGNOSIS — M25474 Effusion, right foot: Secondary | ICD-10-CM | POA: Diagnosis not present

## 2021-10-29 DIAGNOSIS — W458XXA Other foreign body or object entering through skin, initial encounter: Secondary | ICD-10-CM | POA: Diagnosis not present

## 2021-10-29 DIAGNOSIS — S91331A Puncture wound without foreign body, right foot, initial encounter: Secondary | ICD-10-CM | POA: Diagnosis not present

## 2021-10-29 DIAGNOSIS — M79671 Pain in right foot: Secondary | ICD-10-CM | POA: Diagnosis not present

## 2021-11-10 NOTE — Progress Notes (Signed)
Referring-Walter Renne Crigler, MD Reason for referral-dyspnea on exertion  HPI: 66 year old female for evaluation of dyspnea on exertion the request of Merri Brunette, MD.  Patient seen previously but not since November 2017.  Echocardiogram in 2010 showed normal LV function with trace mitral regurgitation.  Treadmill was negative.  Patient also noted to have a history of bradycardia.  Calcium score January 2023 46 which was 69th percentile.  Patient exercises routinely.  Over the past year she has noticed progressive dyspnea on exertion.  She denies orthopnea, PND, pedal edema, chest pain or syncope.  She also notes fatigue.  This is despite losing weight.  Cardiology now asked to evaluate.  Note she does not have significant risk factors for pulmonary embolus.  Current Outpatient Medications  Medication Sig Dispense Refill   Bempedoic Acid-Ezetimibe (NEXLIZET) 180-10 MG TABS 1 tablet     No current facility-administered medications for this visit.    No Known Allergies   Past Medical History:  Diagnosis Date   Bradycardia    Hyperlipidemia     Past Surgical History:  Procedure Laterality Date   MOHS SURGERY     chest   TONSILLECTOMY      Social History   Socioeconomic History   Marital status: Married    Spouse name: Not on file   Number of children: 1   Years of education: Not on file   Highest education level: Not on file  Occupational History   Not on file  Tobacco Use   Smoking status: Former    Types: Cigarettes    Quit date: 04/04/1978    Years since quitting: 43.6   Smokeless tobacco: Never  Vaping Use   Vaping Use: Never used  Substance and Sexual Activity   Alcohol use: Yes    Alcohol/week: 3.0 standard drinks of alcohol    Types: 3 Glasses of wine per week    Comment: Occasional   Drug use: No   Sexual activity: Not Currently    Partners: Male    Birth control/protection: Post-menopausal    Comment: older than 16, more than 5  Other Topics Concern   Not  on file  Social History Narrative   Not on file   Social Determinants of Health   Financial Resource Strain: Not on file  Food Insecurity: Not on file  Transportation Needs: Not on file  Physical Activity: Not on file  Stress: Not on file  Social Connections: Not on file  Intimate Partner Violence: Not on file    Family History  Problem Relation Age of Onset   Cancer Mother        lung   Cancer Father        esophageal    Cancer Sister        lung - brain    Cancer Brother        colon    ROS: no fevers or chills, productive cough, hemoptysis, dysphasia, odynophagia, melena, hematochezia, dysuria, hematuria, rash, seizure activity, orthopnea, PND, pedal edema, claudication. Remaining systems are negative.  Physical Exam:   Blood pressure 120/60, pulse (!) 49, height 5\' 7"  (1.702 m), weight 131 lb 9.6 oz (59.7 kg), SpO2 99 %.  General:  Well developed/well nourished in NAD Skin warm/dry Patient not depressed No peripheral clubbing Back-normal HEENT-normal/normal eyelids Neck supple/normal carotid upstroke bilaterally; no bruits; no JVD; no thyromegaly chest - CTA/ normal expansion CV - RRR/normal S1 and S2; no murmurs, rubs or gallops;  PMI nondisplaced Abdomen -NT/ND, no  HSM, no mass, + bowel sounds, no bruit 2+ femoral pulses, no bruits Ext-no edema, chords, 2+ DP Neuro-grossly nonfocal  ECG -ectopic atrial bradycardia, RV conduction delay, no ST changes.  Personally reviewed  A/P  1 dyspnea-etiology unclear but patient states she has had progressive dyspnea on exertion for 1 year.  Previous calcium score mildly elevated.  I will arrange a cardiac CTA to rule out obstructive disease.  We will also schedule echocardiogram to assess LV function.  2 coronary calcification-patient was originally placed on Crestor 40 mg daily.  However she felt it was affecting her memory.  She therefore discontinued this medication.  She is agreeable to trying 20 mg daily to see if  she tolerates.  If so we will check lipids and liver in 8 weeks.  3 history of bradycardia-heart rate presently 49.  She has not had syncope.  Would not pursue further evaluation.  4 hyperlipidemia-add Crestor as outlined above.  5 fatigue-etiology unclear.  Will check TSH and hemoglobin.  Olga Millers, MD

## 2021-11-23 ENCOUNTER — Encounter: Payer: Self-pay | Admitting: Cardiology

## 2021-11-23 ENCOUNTER — Ambulatory Visit: Payer: Medicare Other | Admitting: Cardiology

## 2021-11-23 VITALS — BP 120/60 | HR 49 | Ht 67.0 in | Wt 131.6 lb

## 2021-11-23 DIAGNOSIS — R001 Bradycardia, unspecified: Secondary | ICD-10-CM

## 2021-11-23 DIAGNOSIS — I2510 Atherosclerotic heart disease of native coronary artery without angina pectoris: Secondary | ICD-10-CM | POA: Diagnosis not present

## 2021-11-23 DIAGNOSIS — R0609 Other forms of dyspnea: Secondary | ICD-10-CM

## 2021-11-23 DIAGNOSIS — I2584 Coronary atherosclerosis due to calcified coronary lesion: Secondary | ICD-10-CM

## 2021-11-23 DIAGNOSIS — E7800 Pure hypercholesterolemia, unspecified: Secondary | ICD-10-CM

## 2021-11-23 MED ORDER — ROSUVASTATIN CALCIUM 20 MG PO TABS: 20.0000 mg | ORAL_TABLET | Freq: Every day | ORAL | 5 refills | Status: AC

## 2021-11-23 NOTE — Patient Instructions (Signed)
Medication Instructions:  START Rosuvastatin 20 mg once daily  *If you need a refill on your cardiac medications before your next appointment, please call your pharmacy*   Lab Work: Your provider would like for you to have the following labs today: BMET, CBC and TSH  Your provider would like for you to return in 2 months to have the following labs drawn: fasting Lipid and Liver. You do not need an appointment for the lab. Once in our office lobby there is a podium where you can sign in and ring the doorbell to alert [place] that you are here. The lab is open from 8:00 am to 4 pm; closed for lunch from 12:45pm-1:45pm.  You may also go to any of these [facility] locations:   [facility] - [address] ([facility]) - [address] [phone] [address]   [place] - [address]    [place]  - [address]    [place] - [address] - [address] [employer]  - [address] [place] [address] ([facility]   If you have labs (blood work) drawn today and your tests are completely normal, you will receive your results only by: [employer] (if you have MyChart) OR A paper copy in the mail If you have any lab test that is abnormal or we need to change your treatment, we will call you to review the results.   Testing/Procedures: Your physician has requested that you have an echocardiogram. Echocardiography is a painless test that uses sound waves to create images of your heart. It provides your doctor with information about the size and shape of your heart and how well your heart's chambers and valves are working. You may receive an ultrasound enhancing agent through an IV if needed to better visualize your heart during the echo.This procedure takes approximately one hour. There are no restrictions for this procedure. This will take place at the [address].     Follow-Up: At [facility], you and your health needs are our priority.  As part of our continuing mission to provide you with exceptional heart care, we have created designated Provider Care Teams.  These Care Teams include your primary Cardiologist (physician) and Advanced Practice Providers (APPs -  Physician Assistants and Nurse Practitioners) who all work together to provide you with the care you need, when you need it.  We recommend signing up for the patient portal called "MyChart".  Sign up information is provided on this After Visit Summary.  MyChart is used to connect with patients for Virtual Visits (Telemedicine).  Patients are able to view lab/test results, encounter notes, upcoming appointments, etc.  Non-urgent messages can be sent to your provider as well.   To learn more about what you can do with MyChart, go to [email or web address].    Your next appointment:   6 month(s)  The format for your next appointment:   In Person  Provider:   Olga Millers, MD {    Other Instructions   Your cardiac CT will be scheduled at one of the below locations:   [facility] [address] [place], [place] [address] [phone]  OR  [facility] [address] [place], [place] [address] [phone]  If scheduled at [facility], please arrive at the  Women's and Children's Entrance (Entrance C2) of [facility] 30 minutes prior to test start time. You can use the FREE valet parking offered at entrance C (encouraged to control the heart rate for the test)  Proceed to the [facility] Radiology Department (first floor) to check-in and test prep.  All radiology patients and guests should use entrance C2 at [facility], accessed from [facility], even though the hospital's physical address listed is [address].    If scheduled at [facility], please arrive 15 mins early for check-in and test prep.  Please follow these instructions carefully (unless otherwise directed):  On the Night Before the Test: Be sure to Drink plenty of water. Do not consume any caffeinated/decaffeinated beverages or chocolate 12 hours prior to your test. Do not take any antihistamines 12 hours prior to your test.   On the Day of the Test: Drink plenty of water until 1 hour prior to the test. Do not eat any food 4 hours prior to the test. You may take your regular medications prior to the test.  Take metoprolol (Lopressor) two hours prior to test-none needed FEMALES- please wear underwire-free bra if available, avoid dresses & tight clothing  After the Test: Drink plenty of water. After receiving IV contrast, you may experience a mild flushed feeling. This is normal. On occasion, you may experience a mild rash up to 24 hours after the test. This is not dangerous. If this occurs, you can take Benadryl 25 mg and increase your fluid intake. If you experience trouble breathing, this can be serious. If it is severe call 911 IMMEDIATELY. If it is mild, please call our office. If you take any of these medications: Glipizide/Metformin, Avandament, Glucavance, please do not take 48 hours after completing test unless otherwise instructed.  We will call to schedule your test 2-4 weeks out understanding that some insurance companies will need an authorization prior to the service being performed.   For non-scheduling related questions, please contact the cardiac imaging nurse navigator should you have any questions/concerns: Rockwell Alexandria, Cardiac Imaging Nurse Navigator Larey Brick, Cardiac Imaging Nurse Navigator [facility] Direct Office Dial: [phone]   For scheduling needs, including cancellations and rescheduling, please call [place], [phone].

## 2021-11-24 LAB — CBC
Hematocrit: 37.8 % (ref 34.0–46.6)
Hemoglobin: 12.7 g/dL (ref 11.1–15.9)
MCH: 29.9 pg (ref 26.6–33.0)
MCHC: 33.6 g/dL (ref 31.5–35.7)
MCV: 89 fL (ref 79–97)
Platelets: 259 10*3/uL (ref 150–450)
RBC: 4.25 x10E6/uL (ref 3.77–5.28)
RDW: 12.8 % (ref 11.7–15.4)
WBC: 5.7 10*3/uL (ref 3.4–10.8)

## 2021-11-24 LAB — BASIC METABOLIC PANEL
BUN/Creatinine Ratio: 23 (ref 12–28)
BUN: 16 mg/dL (ref 8–27)
CO2: 23 mmol/L (ref 20–29)
Calcium: 9.8 mg/dL (ref 8.7–10.3)
Chloride: 105 mmol/L (ref 96–106)
Creatinine, Ser: 0.7 mg/dL (ref 0.57–1.00)
Glucose: 93 mg/dL (ref 70–99)
Potassium: 4.7 mmol/L (ref 3.5–5.2)
Sodium: 144 mmol/L (ref 134–144)
eGFR: 94 mL/min/{1.73_m2} (ref >59)

## 2021-11-24 LAB — TSH: TSH: 1.72 u[IU]/mL (ref 0.450–4.500)

## 2021-11-24 NOTE — Telephone Encounter (Signed)
2021-05-17     Cholesterol 158   <200  Cholesterol / HDL Ratio 2.11   0.00-4.44  HDL Cholesterol 75   >39  LDL Cholesterol (Calculation) 73   <130  LDL/HDL Ratio 1.0   <3.3  Non-HDL Cholesterol 83   <130  Triglycerides 51   <150  Hepatic Function Panel RS In-house   2021-05-17    Albumin 4.3   3.4-5.0  Alkaline Phosphatase 101   25-150  ALT (SGPT) 40   <6-78  AST (SGOT) 29   0-40  Bilirubin, Direct 0.2   <0.05-0.3  Bilirubin, Total 0.7   0.2-1.0  Protein 6.9   6.4-8.2     Labs from care everywhere

## 2021-12-07 ENCOUNTER — Ambulatory Visit (HOSPITAL_COMMUNITY): Payer: Medicare Other | Attending: Cardiology

## 2021-12-07 DIAGNOSIS — R0609 Other forms of dyspnea: Secondary | ICD-10-CM | POA: Insufficient documentation

## 2021-12-07 LAB — ECHOCARDIOGRAM COMPLETE
Area-P 1/2: 2.61 cm2
MV M vel: 4.39 m/s
MV Peak grad: 77.1 mmHg
P 1/2 time: 1483 msec
Radius: 0.5 cm
S' Lateral: 3.2 cm

## 2021-12-15 ENCOUNTER — Telehealth (HOSPITAL_COMMUNITY): Payer: Self-pay | Admitting: *Deleted

## 2021-12-15 NOTE — Telephone Encounter (Signed)
Attempted to call patient regarding upcoming cardiac CT appointment. °Left message on voicemail with name and callback number ° °Shamikia Linskey RN Navigator Cardiac Imaging °[facility] °[phone] Office °[phone] Cell ° °

## 2021-12-18 ENCOUNTER — Ambulatory Visit (HOSPITAL_COMMUNITY)
Admission: RE | Admit: 2021-12-18 | Discharge: 2021-12-18 | Disposition: A | Discharge status: Home / Self Care | Payer: Medicare Other | Source: Ambulatory Visit | Attending: Cardiology | Admitting: Cardiology

## 2021-12-18 DIAGNOSIS — E7800 Pure hypercholesterolemia, unspecified: Secondary | ICD-10-CM | POA: Insufficient documentation

## 2021-12-18 DIAGNOSIS — R001 Bradycardia, unspecified: Secondary | ICD-10-CM | POA: Insufficient documentation

## 2021-12-18 DIAGNOSIS — I2510 Atherosclerotic heart disease of native coronary artery without angina pectoris: Secondary | ICD-10-CM

## 2021-12-18 DIAGNOSIS — I2584 Coronary atherosclerosis due to calcified coronary lesion: Secondary | ICD-10-CM | POA: Diagnosis not present

## 2021-12-18 DIAGNOSIS — R0609 Other forms of dyspnea: Secondary | ICD-10-CM | POA: Insufficient documentation

## 2021-12-18 MED ORDER — NITROGLYCERIN 0.4 MG SL SUBL
SUBLINGUAL_TABLET | SUBLINGUAL | Status: AC
  Filled 2021-12-18: qty 2

## 2021-12-18 MED ORDER — IOHEXOL 350 MG/ML SOLN
95.0000 mL | Freq: Once | INTRAVENOUS | Status: AC | PRN
  Administered 2021-12-18: 95 mL via INTRAVENOUS

## 2021-12-18 MED ORDER — NITROGLYCERIN 0.4 MG SL SUBL
0.8000 mg | SUBLINGUAL_TABLET | Freq: Once | SUBLINGUAL | Status: AC
  Administered 2021-12-18: 0.8 mg via SUBLINGUAL

## 2021-12-21 ENCOUNTER — Telehealth: Payer: Self-pay | Admitting: *Deleted

## 2021-12-21 DIAGNOSIS — I2510 Atherosclerotic heart disease of native coronary artery without angina pectoris: Secondary | ICD-10-CM

## 2021-12-21 MED ORDER — ASPIRIN 81 MG PO TBEC: 81.0000 mg | DELAYED_RELEASE_TABLET | Freq: Every day | ORAL | 3 refills | Status: AC

## 2021-12-21 NOTE — Telephone Encounter (Signed)
-----   Message from Lelon Perla, MD sent at 12/19/2021  2:27 PM EDT ----- Nonobstructive coronary artery disease.  Add aspirin 81 mg daily.  Continue Crestor. Kirk Ruths

## 2021-12-21 NOTE — Telephone Encounter (Signed)
pt aware of results and dr Jacalyn Lefevre reccommendations

## 2022-01-11 DIAGNOSIS — H903 Sensorineural hearing loss, bilateral: Secondary | ICD-10-CM | POA: Diagnosis not present

## 2022-02-13 DIAGNOSIS — Z23 Encounter for immunization: Secondary | ICD-10-CM | POA: Diagnosis not present

## 2022-02-20 ENCOUNTER — Encounter: Payer: Self-pay | Admitting: *Deleted

## 2022-02-20 ENCOUNTER — Other Ambulatory Visit: Payer: Self-pay | Admitting: *Deleted

## 2022-02-20 DIAGNOSIS — E7800 Pure hypercholesterolemia, unspecified: Secondary | ICD-10-CM

## 2022-02-20 DIAGNOSIS — R0609 Other forms of dyspnea: Secondary | ICD-10-CM

## 2022-02-20 DIAGNOSIS — I2510 Atherosclerotic heart disease of native coronary artery without angina pectoris: Secondary | ICD-10-CM

## 2022-02-20 DIAGNOSIS — R001 Bradycardia, unspecified: Secondary | ICD-10-CM

## 2022-03-08 DIAGNOSIS — I2510 Atherosclerotic heart disease of native coronary artery without angina pectoris: Secondary | ICD-10-CM | POA: Diagnosis not present

## 2022-03-12 DIAGNOSIS — Z0000 Encounter for general adult medical examination without abnormal findings: Secondary | ICD-10-CM | POA: Diagnosis not present

## 2022-03-12 DIAGNOSIS — J309 Allergic rhinitis, unspecified: Secondary | ICD-10-CM | POA: Diagnosis not present

## 2022-03-12 DIAGNOSIS — J310 Chronic rhinitis: Secondary | ICD-10-CM | POA: Diagnosis not present

## 2022-03-12 DIAGNOSIS — R001 Bradycardia, unspecified: Secondary | ICD-10-CM | POA: Diagnosis not present

## 2022-04-04 DIAGNOSIS — K080 Exfoliation of teeth due to systemic causes: Secondary | ICD-10-CM | POA: Diagnosis not present

## 2022-06-12 DIAGNOSIS — L282 Other prurigo: Secondary | ICD-10-CM | POA: Diagnosis not present

## 2022-06-12 DIAGNOSIS — R21 Rash and other nonspecific skin eruption: Secondary | ICD-10-CM | POA: Diagnosis not present

## 2022-06-13 DIAGNOSIS — H5203 Hypermetropia, bilateral: Secondary | ICD-10-CM | POA: Diagnosis not present

## 2022-06-14 DIAGNOSIS — L2089 Other atopic dermatitis: Secondary | ICD-10-CM | POA: Diagnosis not present

## 2022-06-14 DIAGNOSIS — C44612 Basal cell carcinoma of skin of right upper limb, including shoulder: Secondary | ICD-10-CM | POA: Diagnosis not present

## 2022-06-14 DIAGNOSIS — Z85828 Personal history of other malignant neoplasm of skin: Secondary | ICD-10-CM | POA: Diagnosis not present

## 2022-06-14 DIAGNOSIS — L814 Other melanin hyperpigmentation: Secondary | ICD-10-CM | POA: Diagnosis not present

## 2022-06-14 DIAGNOSIS — L570 Actinic keratosis: Secondary | ICD-10-CM | POA: Diagnosis not present

## 2022-09-03 IMAGING — MG MM DIGITAL SCREENING BILAT W/ TOMO AND CAD
6 of 10 series · 6 of 30 positions shown · non-contrast
Comparison: Previous exam(s).

CLINICAL DATA: Screening.

EXAM:
DIGITAL SCREENING BILATERAL MAMMOGRAM WITH TOMOSYNTHESIS AND CAD
TECHNIQUE: Bilateral screening digital craniocaudal and mediolateral oblique
mammograms were obtained. Bilateral screening digital breast
tomosynthesis was performed. The images were evaluated with
computer-aided detection.

[R CC synth-2D (1 of 2)]
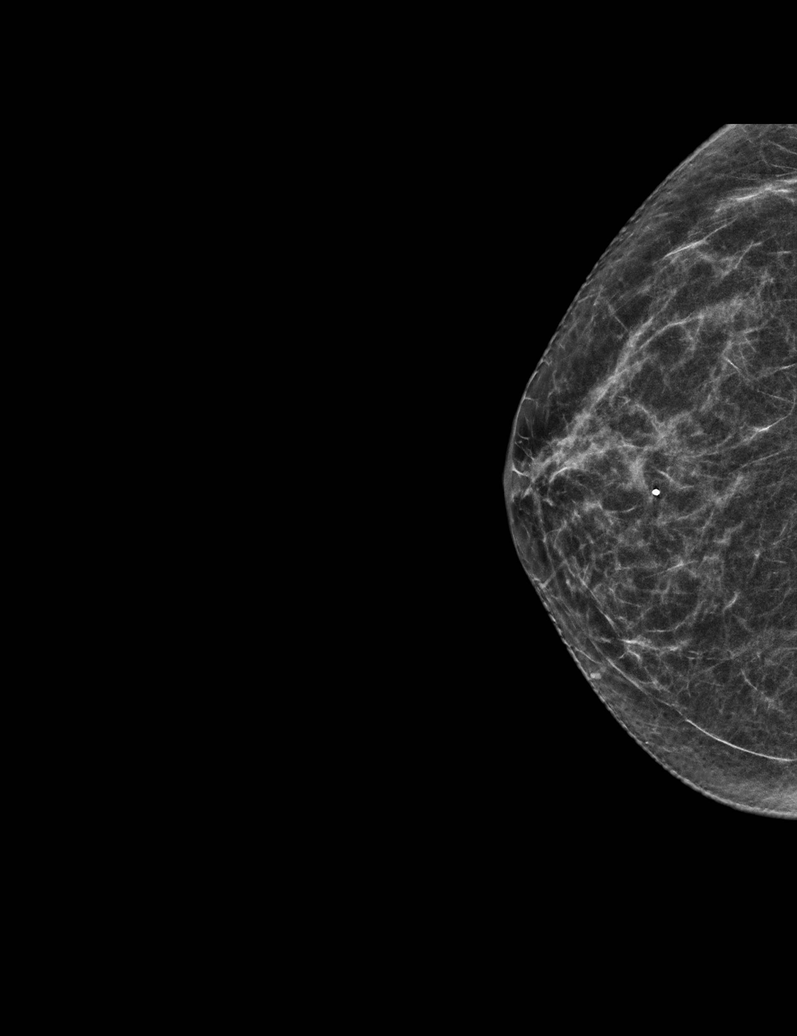

[R MLO synth-2D]
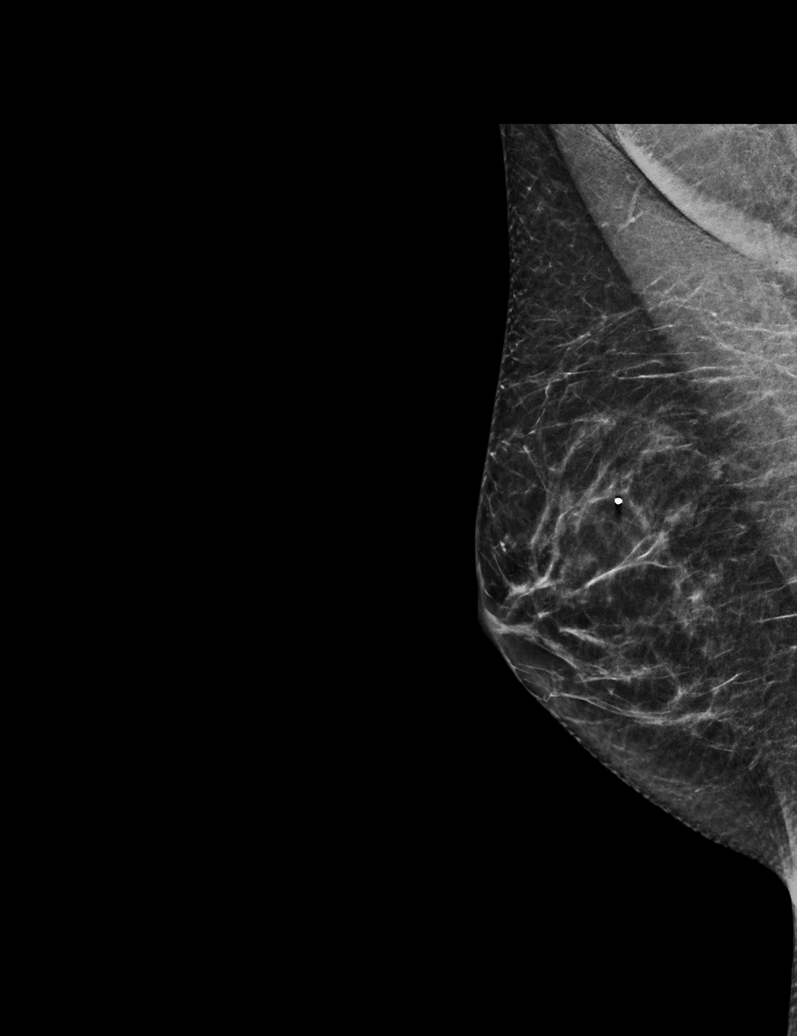

[L CC synth-2D]
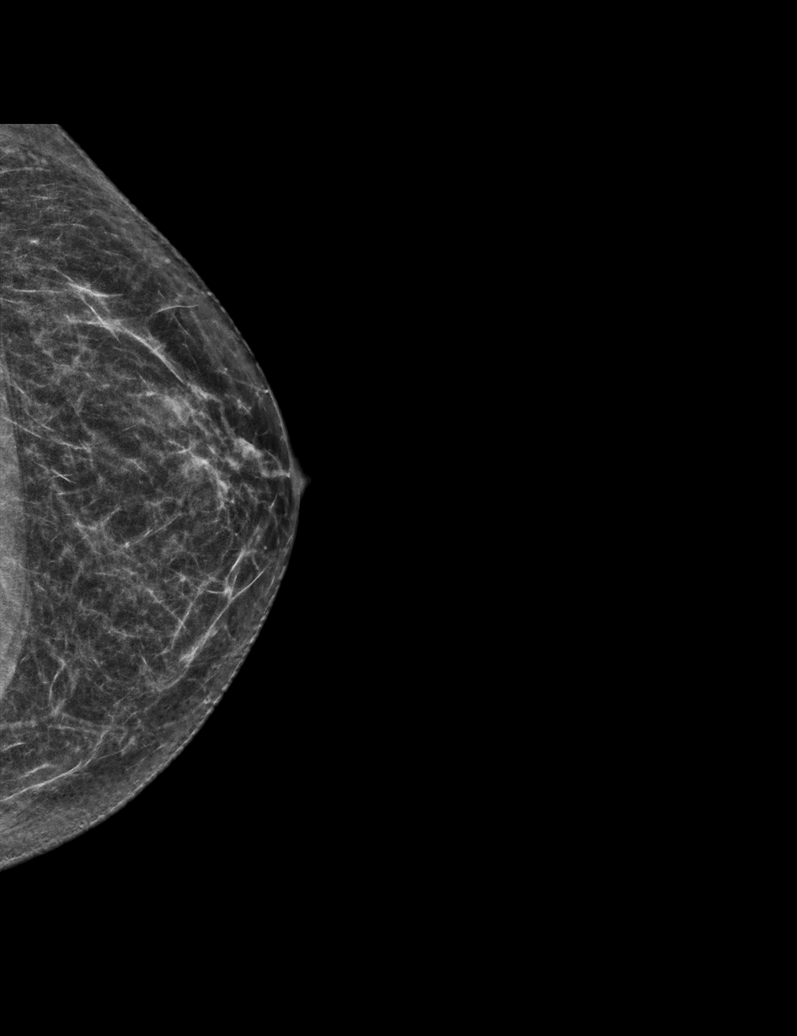

[R CC synth-2D (2 of 2)]
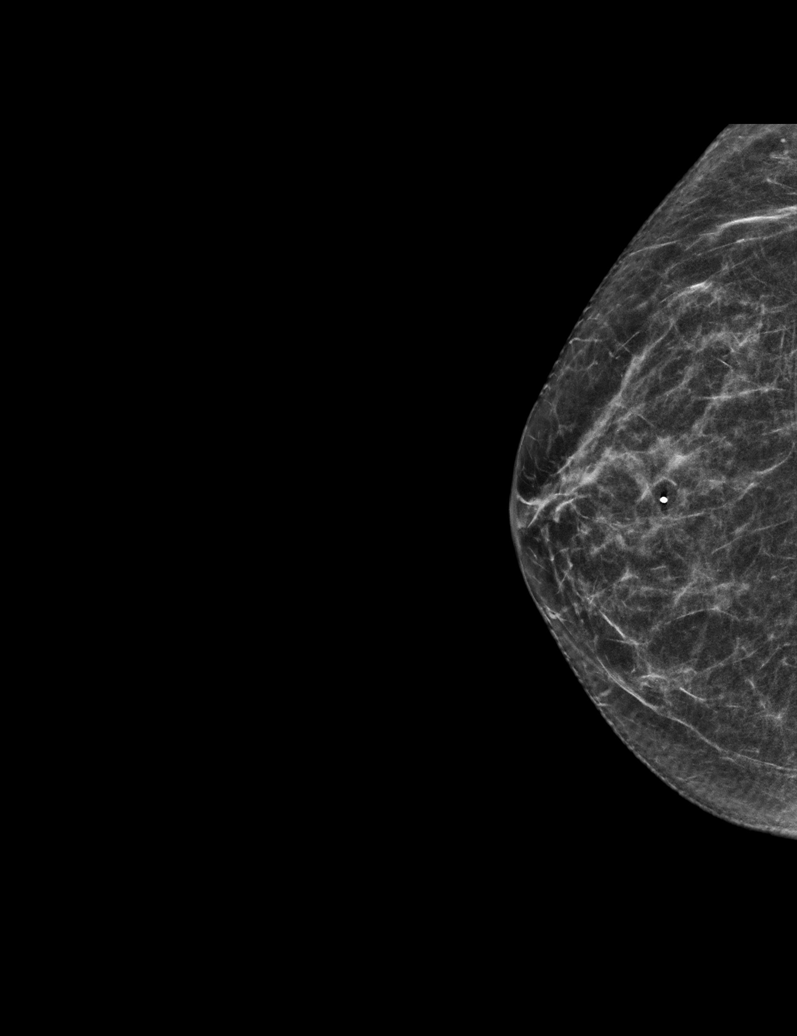

[L MLO synth-2D]
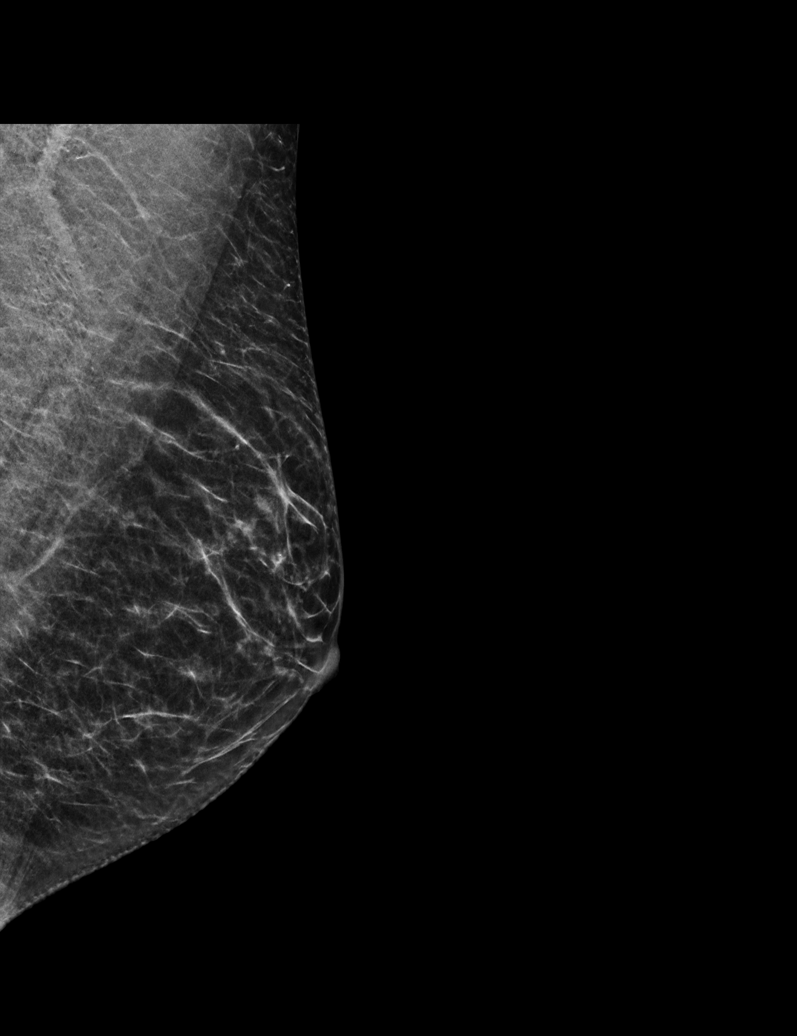

[L MLO tomo · tomo slice 26/51.0]
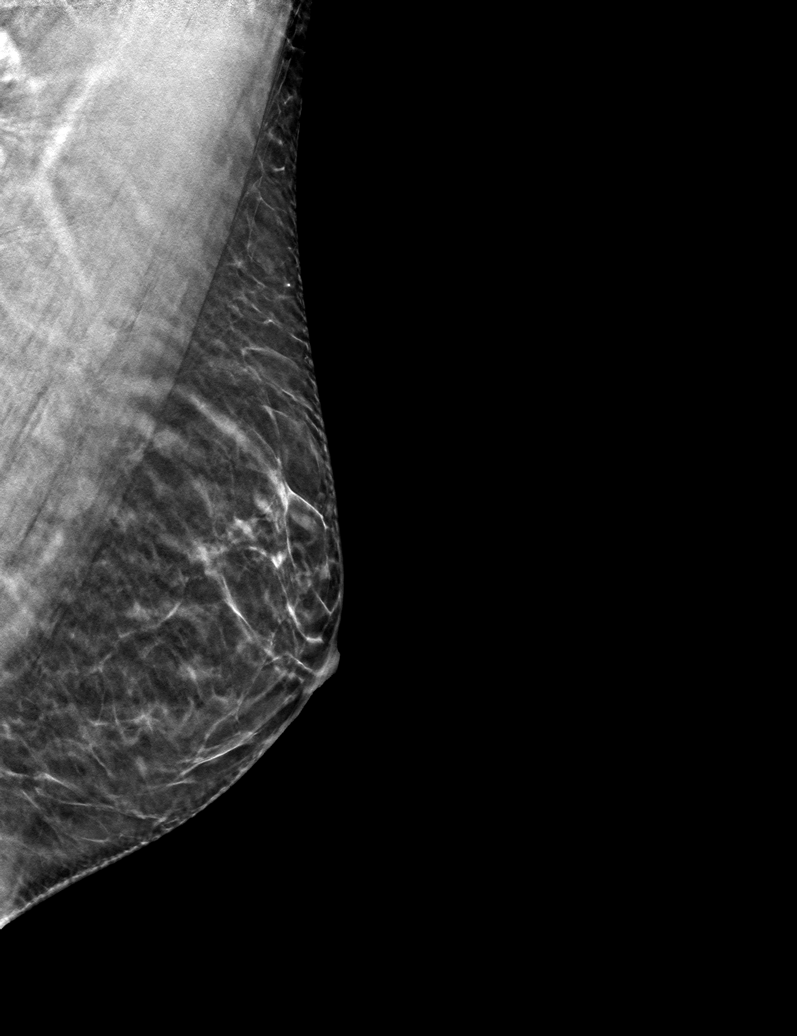

[6 of 30 positions shown; findings below may reference images not displayed]

ACR Breast Density Category b: There are scattered areas of
fibroglandular density.
FINDINGS: There are no findings suspicious for malignancy.
IMPRESSION: No mammographic evidence of malignancy. A result letter of this
screening mammogram will be mailed directly to the patient.

RECOMMENDATION:
Screening mammogram in one year. (Code:51-O-LD2)

BI-RADS CATEGORY  1: Negative.

## 2022-09-18 DIAGNOSIS — I2510 Atherosclerotic heart disease of native coronary artery without angina pectoris: Secondary | ICD-10-CM | POA: Diagnosis not present

## 2022-10-10 DIAGNOSIS — K080 Exfoliation of teeth due to systemic causes: Secondary | ICD-10-CM | POA: Diagnosis not present

## 2022-10-10 DIAGNOSIS — R413 Other amnesia: Secondary | ICD-10-CM | POA: Diagnosis not present

## 2022-11-22 DIAGNOSIS — Z9189 Other specified personal risk factors, not elsewhere classified: Secondary | ICD-10-CM | POA: Diagnosis not present

## 2022-11-22 DIAGNOSIS — R413 Other amnesia: Secondary | ICD-10-CM | POA: Diagnosis not present

## 2022-12-11 DIAGNOSIS — L738 Other specified follicular disorders: Secondary | ICD-10-CM | POA: Diagnosis not present

## 2022-12-11 DIAGNOSIS — I8391 Asymptomatic varicose veins of right lower extremity: Secondary | ICD-10-CM | POA: Diagnosis not present

## 2022-12-11 DIAGNOSIS — L570 Actinic keratosis: Secondary | ICD-10-CM | POA: Diagnosis not present

## 2022-12-11 DIAGNOSIS — L818 Other specified disorders of pigmentation: Secondary | ICD-10-CM | POA: Diagnosis not present

## 2022-12-11 DIAGNOSIS — D0362 Melanoma in situ of left upper limb, including shoulder: Secondary | ICD-10-CM | POA: Diagnosis not present

## 2022-12-11 DIAGNOSIS — Z85828 Personal history of other malignant neoplasm of skin: Secondary | ICD-10-CM | POA: Diagnosis not present

## 2022-12-11 DIAGNOSIS — D485 Neoplasm of uncertain behavior of skin: Secondary | ICD-10-CM | POA: Diagnosis not present

## 2022-12-31 DIAGNOSIS — D0362 Melanoma in situ of left upper limb, including shoulder: Secondary | ICD-10-CM | POA: Diagnosis not present

## 2022-12-31 DIAGNOSIS — L988 Other specified disorders of the skin and subcutaneous tissue: Secondary | ICD-10-CM | POA: Diagnosis not present

## 2023-01-14 ENCOUNTER — Ambulatory Visit (INDEPENDENT_AMBULATORY_CARE_PROVIDER_SITE_OTHER): Payer: Medicare Other | Admitting: Otolaryngology

## 2023-01-15 ENCOUNTER — Other Ambulatory Visit: Payer: Self-pay | Admitting: Internal Medicine

## 2023-01-15 DIAGNOSIS — Z1231 Encounter for screening mammogram for malignant neoplasm of breast: Secondary | ICD-10-CM

## 2023-01-22 ENCOUNTER — Ambulatory Visit: Payer: Medicare Other | Admitting: Obstetrics and Gynecology

## 2023-01-22 DIAGNOSIS — R300 Dysuria: Secondary | ICD-10-CM | POA: Diagnosis not present

## 2023-02-07 DIAGNOSIS — R0683 Snoring: Secondary | ICD-10-CM | POA: Diagnosis not present

## 2023-02-12 ENCOUNTER — Ambulatory Visit
Admission: RE | Admit: 2023-02-12 | Discharge: 2023-02-12 | Disposition: A | Discharge status: Home / Self Care | Payer: Medicare Other | Source: Ambulatory Visit | Attending: Internal Medicine | Admitting: Internal Medicine

## 2023-02-12 DIAGNOSIS — Z1231 Encounter for screening mammogram for malignant neoplasm of breast: Secondary | ICD-10-CM | POA: Diagnosis not present

## 2023-02-13 DIAGNOSIS — R0683 Snoring: Secondary | ICD-10-CM | POA: Diagnosis not present

## 2023-03-18 DIAGNOSIS — I2510 Atherosclerotic heart disease of native coronary artery without angina pectoris: Secondary | ICD-10-CM | POA: Diagnosis not present

## 2023-03-22 DIAGNOSIS — Z0000 Encounter for general adult medical examination without abnormal findings: Secondary | ICD-10-CM | POA: Diagnosis not present

## 2023-04-08 DIAGNOSIS — Z1211 Encounter for screening for malignant neoplasm of colon: Secondary | ICD-10-CM | POA: Diagnosis not present

## 2023-04-08 DIAGNOSIS — Z1212 Encounter for screening for malignant neoplasm of rectum: Secondary | ICD-10-CM | POA: Diagnosis not present

## 2023-04-16 DIAGNOSIS — K080 Exfoliation of teeth due to systemic causes: Secondary | ICD-10-CM | POA: Diagnosis not present

## 2023-06-06 DIAGNOSIS — L308 Other specified dermatitis: Secondary | ICD-10-CM | POA: Diagnosis not present

## 2023-06-06 DIAGNOSIS — I788 Other diseases of capillaries: Secondary | ICD-10-CM | POA: Diagnosis not present

## 2023-06-06 DIAGNOSIS — Z85828 Personal history of other malignant neoplasm of skin: Secondary | ICD-10-CM | POA: Diagnosis not present

## 2023-06-06 DIAGNOSIS — L570 Actinic keratosis: Secondary | ICD-10-CM | POA: Diagnosis not present

## 2023-06-19 DIAGNOSIS — H2513 Age-related nuclear cataract, bilateral: Secondary | ICD-10-CM | POA: Diagnosis not present

## 2023-09-25 DIAGNOSIS — M791 Myalgia, unspecified site: Secondary | ICD-10-CM | POA: Diagnosis not present

## 2023-09-26 DIAGNOSIS — G720 Drug-induced myopathy: Secondary | ICD-10-CM | POA: Diagnosis not present

## 2023-09-26 DIAGNOSIS — I2510 Atherosclerotic heart disease of native coronary artery without angina pectoris: Secondary | ICD-10-CM | POA: Diagnosis not present

## 2023-12-17 DIAGNOSIS — I2510 Atherosclerotic heart disease of native coronary artery without angina pectoris: Secondary | ICD-10-CM | POA: Diagnosis not present

## 2024-01-07 ENCOUNTER — Other Ambulatory Visit: Payer: Self-pay | Admitting: Internal Medicine

## 2024-01-07 DIAGNOSIS — Z1231 Encounter for screening mammogram for malignant neoplasm of breast: Secondary | ICD-10-CM

## 2024-01-14 DIAGNOSIS — L570 Actinic keratosis: Secondary | ICD-10-CM | POA: Diagnosis not present

## 2024-01-14 DIAGNOSIS — L821 Other seborrheic keratosis: Secondary | ICD-10-CM | POA: Diagnosis not present

## 2024-01-14 DIAGNOSIS — D1801 Hemangioma of skin and subcutaneous tissue: Secondary | ICD-10-CM | POA: Diagnosis not present

## 2024-01-14 DIAGNOSIS — L814 Other melanin hyperpigmentation: Secondary | ICD-10-CM | POA: Diagnosis not present

## 2024-01-14 DIAGNOSIS — C44519 Basal cell carcinoma of skin of other part of trunk: Secondary | ICD-10-CM | POA: Diagnosis not present

## 2024-01-14 DIAGNOSIS — Z85828 Personal history of other malignant neoplasm of skin: Secondary | ICD-10-CM | POA: Diagnosis not present

## 2024-01-20 DIAGNOSIS — I2510 Atherosclerotic heart disease of native coronary artery without angina pectoris: Secondary | ICD-10-CM | POA: Diagnosis not present

## 2024-02-14 ENCOUNTER — Ambulatory Visit
Admission: RE | Admit: 2024-02-14 | Discharge: 2024-02-14 | Disposition: A | Discharge status: Home / Self Care | Source: Ambulatory Visit | Attending: Internal Medicine | Admitting: Internal Medicine

## 2024-02-14 DIAGNOSIS — Z1231 Encounter for screening mammogram for malignant neoplasm of breast: Secondary | ICD-10-CM

## 2024-03-02 DIAGNOSIS — I2510 Atherosclerotic heart disease of native coronary artery without angina pectoris: Secondary | ICD-10-CM | POA: Diagnosis not present

## 2024-03-09 DIAGNOSIS — M545 Low back pain, unspecified: Secondary | ICD-10-CM | POA: Diagnosis not present

## 2024-03-09 DIAGNOSIS — M25559 Pain in unspecified hip: Secondary | ICD-10-CM | POA: Diagnosis not present

## 2024-03-11 DIAGNOSIS — M25559 Pain in unspecified hip: Secondary | ICD-10-CM | POA: Diagnosis not present

## 2024-03-11 DIAGNOSIS — H2513 Age-related nuclear cataract, bilateral: Secondary | ICD-10-CM | POA: Diagnosis not present

## 2024-03-11 DIAGNOSIS — M545 Low back pain, unspecified: Secondary | ICD-10-CM | POA: Diagnosis not present

## 2024-03-16 DIAGNOSIS — M25559 Pain in unspecified hip: Secondary | ICD-10-CM | POA: Diagnosis not present

## 2024-03-16 DIAGNOSIS — M545 Low back pain, unspecified: Secondary | ICD-10-CM | POA: Diagnosis not present
